# Patient Record
Sex: Female | Born: 1940 | Hispanic: Yes | Smoking: Never smoker
Health system: Southern US, Community
[De-identification: ages and names within clinical notes are randomized; demographics above are authoritative.]

## PROBLEM LIST (undated history)

## (undated) DIAGNOSIS — K859 Acute pancreatitis without necrosis or infection, unspecified: Secondary | ICD-10-CM

## (undated) DIAGNOSIS — E7800 Pure hypercholesterolemia, unspecified: Secondary | ICD-10-CM

## (undated) DIAGNOSIS — R12 Heartburn: Secondary | ICD-10-CM

## (undated) DIAGNOSIS — K5901 Slow transit constipation: Secondary | ICD-10-CM

## (undated) DIAGNOSIS — I10 Essential (primary) hypertension: Secondary | ICD-10-CM

## (undated) DIAGNOSIS — Z8669 Personal history of other diseases of the nervous system and sense organs: Secondary | ICD-10-CM

## (undated) HISTORY — DX: Heartburn: R12

## (undated) HISTORY — DX: Essential (primary) hypertension: I10

## (undated) HISTORY — DX: Pure hypercholesterolemia, unspecified: E78.00

## (undated) HISTORY — DX: Acute pancreatitis without necrosis or infection, unspecified: K85.90

## (undated) HISTORY — PX: CHOLECYSTECTOMY: SHX55

## (undated) HISTORY — DX: Personal history of other diseases of the nervous system and sense organs: Z86.69

## (undated) HISTORY — DX: Slow transit constipation: K59.01

---

## 2015-06-19 ENCOUNTER — Ambulatory Visit: Payer: Self-pay | Admitting: Family Medicine

## 2015-06-21 ENCOUNTER — Ambulatory Visit (INDEPENDENT_AMBULATORY_CARE_PROVIDER_SITE_OTHER)
Admission: RE | Admit: 2015-06-21 | Discharge: 2015-06-21 | Disposition: A | Discharge status: Home / Self Care | Payer: Medicare Other | Source: Ambulatory Visit | Attending: Family Medicine | Admitting: Family Medicine

## 2015-06-21 ENCOUNTER — Encounter: Payer: Self-pay | Admitting: Family Medicine

## 2015-06-21 ENCOUNTER — Ambulatory Visit (INDEPENDENT_AMBULATORY_CARE_PROVIDER_SITE_OTHER): Payer: Medicare Other | Admitting: Family Medicine

## 2015-06-21 VITALS — BP 132/80 | HR 77 | Wt 175.0 lb

## 2015-06-21 DIAGNOSIS — M79604 Pain in right leg: Secondary | ICD-10-CM | POA: Diagnosis not present

## 2015-06-21 DIAGNOSIS — M25561 Pain in right knee: Secondary | ICD-10-CM | POA: Diagnosis not present

## 2015-06-21 DIAGNOSIS — M79605 Pain in left leg: Secondary | ICD-10-CM | POA: Diagnosis not present

## 2015-06-21 DIAGNOSIS — M25562 Pain in left knee: Secondary | ICD-10-CM | POA: Diagnosis not present

## 2015-06-21 MED ORDER — GABAPENTIN 100 MG PO CAPS: 100.0000 mg | ORAL_CAPSULE | Freq: Every day | ORAL | Status: DC

## 2015-06-21 NOTE — Progress Notes (Signed)
[name] [address] [place], [place] [address] Phone: [phone] Subjective:   \  CC: bilaterally pain  EXB:[id number] Robin Hayes is a 75 y.o. female coming in with complaint of bilaterally pain. Patient is accompanied with son who is translating. Patient states that she is having bilateral calf pain. Has been going on for quite some time. Seems to be worsening recently. Seems to be worse at night. States that it is more of a cramping sensation. States very localized. Denies any numbness. Sometimes can give a dull aching throbbing sensation even when she is walking. Would not state that it is weakness but states that she does fatigue earlier. Has been a little less active than she was previously. Denies any significant swelling but has seen a vascular surgeon recently for possible vein stripping per son. Patient has been wearing compression sleeve with no significant improvement. Recent severity of pain is 8 out of 10. Patient states that the most concerning part is is affecting her sleep.     Past Medical History  Diagnosis Date  . Heart burn   . Hx of migraines   hypertension, Past Surgical History  Procedure Laterality Date  . Cesarean section     Social History   Social History  . Marital Status: Married    Spouse Name: N/A  . Number of Children: N/A  . Years of Education: N/A   Social History Main Topics  . Smoking status: Never Smoker   . Smokeless tobacco: Never Used  . Alcohol Use: No  . Drug Use: No  . Sexual Activity: Not on file   Other Topics Concern  . Not on file   Social History Narrative  . No narrative on file   Not on [place] known drug allergies No family history on file.no family history of rheumatological diseases.  Past medical history, social, surgical and family history all reviewed in electronic medical record.  No pertanent information unless stated regarding to the chief complaint.   Review of  Systems: No headache, visual changes, nausea, vomiting, diarrhea, constipation, dizziness, abdominal pain, skin rash, fevers, chills, night sweats, weight loss, swollen lymph nodes, body aches, joint swelling, muscle aches, chest pain, shortness of breath, mood changes.   Objective Blood pressure 132/80, pulse 77, weight 175 lb (79.379 kg), SpO2 96 %.  General: No apparent distress alert and oriented x3 mood and affect normal, dressed appropriately. obese HEENT: Pupils equal, extraocular movements intact  Respiratory: Patient's speak in full sentences and does not appear short of breath  Cardiovascular: No lower extremity edema, non tender, no erythema  Skin: Warm dry intact with no signs of infection or rash on extremities or on axial skeleton.  Abdomen: Soft nontender  Neuro: Cranial nerves II through XII are intact, neurovascularly intact in all extremities with 2+ DTRs and 2+ pulses.  Lymph: No lymphadenopathy of posterior or anterior cervical chain or axillae bilaterally.  Gait normal with good balance and coordination.  MSK:  Non tender with full range of motion and good stability and symmetric strength and tone of shoulders, elbows, wrist, hip, knee and ankles bilaterally.  Negative straight leg test. Patient is minimally tender to palpation over the piriformis musculature of the lumbar spine bilaterally. No spinous process tenderness. Neurovascular intact distally with full strength. 2+ DTRs. NONTENDER to compression. Soft no mass. Neurovascularly intact distally with good capillary refill.    Impression and Recommendations:     This case required medical decision making of moderate  complexity.      Note: This dictation was prepared with Dragon dictation along with smaller phrase technology. Any transcriptional errors that result from this process are unintentional.

## 2015-06-21 NOTE — Assessment & Plan Note (Signed)
Patient is having pain in the legs bilaterally. Secondary to patient's body habitus and being a poor historian this is a very broad differential. This can be anything from spinal stenosis to possible iron deficiency and muscle injuries. No sign of an injury today. We discussed with patient about an x-ray of the back to rule out any type of osteoarthritic changes of the back that could be contributing. Patient will be started on gabapentin 100 mg at night to see if this would be and no neurologic component that could be contribute in. In addition of this we discussed over-the-counter natural supplementations that are safe and effective for any type of cramping that seems to be more restless leg syndrome. We discussed continuing the compression sleeve to neovascular compromise could also be contribute. Patient has good dorsalis pedis pulses today. Patient will try to make these easy changes as well as we discussed proper shoe choices. If worsening symptoms in 3 weeks. Patient follows up we'll consider further imaging of the back for further workup or possibly even formal physical therapy.

## 2015-06-21 NOTE — Patient Instructions (Signed)
Good to see you  For the leg pain we will get an xray downstairs today of back pain Gabapentin  at nightcan help with sleep and the leg pain.  This is a prescription Vitamin D 2000 IU daily Iron 38-65mg  with  of vitamin C 3 times a week can help Continue with the compression stocking I hope it helps See me again in 3 weeks and if not better we will try a couple other tests.

## 2015-07-12 ENCOUNTER — Ambulatory Visit (INDEPENDENT_AMBULATORY_CARE_PROVIDER_SITE_OTHER): Payer: Medicare Other | Admitting: Family Medicine

## 2015-07-12 ENCOUNTER — Encounter: Payer: Self-pay | Admitting: Family Medicine

## 2015-07-12 VITALS — BP 136/84 | HR 77 | Wt 175.0 lb

## 2015-07-12 DIAGNOSIS — M79605 Pain in left leg: Secondary | ICD-10-CM | POA: Diagnosis not present

## 2015-07-12 DIAGNOSIS — M79604 Pain in right leg: Secondary | ICD-10-CM

## 2015-07-12 NOTE — Progress Notes (Signed)
  Robin Hayes D.O. [facility] [address] [place], [place] [id number] Phone: [phone] Subjective:   \  CC: bilaterally leg and back pain f/u   BJY:NWGNFAOZHYHPI:Subjective Robin Hayes is a 75 y.o. female coming in with complaint of bilaterally leg pain .  Concern with patient having potential spinal stenosis. Patient also could've potentially had peripheral neuropathy. Patient was given gabapentin. Patient states that she is doing 100% better. No longer having any back pain. Sitting currently at night. States that the leg pain is doing much better. She is being treated with varicose veins and is having some discomfort with this. Patient states that that is very manageable and is taking ibuprofen occasionally. Patient was a happy with the results.     Past Medical History  Diagnosis Date  . Heart burn   . Hx of migraines   hypertension, Past Surgical History  Procedure Laterality Date  . Cesarean section     Social History   Social History  . Marital Status: Married    Spouse Name: N/A  . Number of Children: N/A  . Years of Education: N/A   Social History Main Topics  . Smoking status: Never Smoker   . Smokeless tobacco: Never Used  . Alcohol Use: No  . Drug Use: No  . Sexual Activity: Not Asked   Other Topics Concern  . None   Social History Narrative   Not on [place] known drug allergies No family history on file.no family history of rheumatological diseases.  Past medical history, social, surgical and family history all reviewed in electronic medical record.  No pertanent information unless stated regarding to the chief complaint.   Review of Systems: No headache, visual changes, nausea, vomiting, diarrhea, constipation, dizziness, abdominal pain, skin rash, fevers, chills, night sweats, weight loss, swollen lymph nodes, body aches, joint swelling, muscle aches, chest pain, shortness of breath, mood changes.   Objective Blood pressure 136/84, pulse 77, weight  175 lb (79.379 kg), SpO2 96 %.  General: No apparent distress alert and oriented x3 mood and affect normal, dressed appropriately. obese HEENT: Pupils equal, extraocular movements intact  Respiratory: Patient's speak in full sentences and does not appear short of breath  Cardiovascular: No lower extremity edema, non tender, no erythema  Skin: Warm dry intact with no signs of infection or rash on extremities or on axial skeleton.  Abdomen: Soft nontender  Neuro: Cranial nerves II through XII are intact, neurovascularly intact in all extremities with 2+ DTRs and 2+ pulses.  Lymph: No lymphadenopathy of posterior or anterior cervical chain or axillae bilaterally.  Gait normal with good balance and coordination.  MSK:  Non tender with full range of motion and good stability and symmetric strength and tone of shoulders, elbows, wrist, hip, knee and ankles bilaterally.  Negative straight leg test.  Nontender on the back today. No spinous process tenderness. Neurovascular intact distally with full strength. 2+ DTRs. NONTENDER to compression. Soft no mass. Neurovascularly intact distally with good capillary refill.    Impression and Recommendations:     This case required medical decision making of moderate complexity.      Note: This dictation was prepared with Dragon dictation along with smaller phrase technology. Any transcriptional errors that result from this process are unintentional.

## 2015-07-12 NOTE — Assessment & Plan Note (Signed)
I do believe that the pain is likely from  Lumbar radiculopathy. Doing much better though at this time. No further workup. Continue to refill gabapentin as needed. Follow-up as needed.

## 2015-07-12 NOTE — Patient Instructions (Signed)
You are doing great  Continue the gabapentin indefinitely at night Ice when you are in pain  OK to take aleve and tylenol when needed Aleve 2 times a day and can take tylenol 3 times a day if needed Stay active, walking, biking or walking in pool could be good.  Bromelain is a enzyme that can help with gut inflammation.  2500mg  2 times daily  See me when you need me.

## 2016-12-09 ENCOUNTER — Emergency Department (HOSPITAL_COMMUNITY)
Admission: EM | Admit: 2016-12-09 | Discharge: 2016-12-10 | Discharge status: Absent without leave | Payer: Medicare Other

## 2016-12-12 DIAGNOSIS — R7989 Other specified abnormal findings of blood chemistry: Secondary | ICD-10-CM | POA: Diagnosis present

## 2016-12-12 DIAGNOSIS — K819 Cholecystitis, unspecified: Secondary | ICD-10-CM | POA: Insufficient documentation

## 2017-02-18 IMAGING — DX DG LUMBAR SPINE COMPLETE 4+V
5 series · 5 of 5 positions shown · non-contrast
Comparison: None.

CLINICAL DATA: Low back pain and bilateral leg pain with numbness
for months, no injury

EXAM:
LUMBAR SPINE - COMPLETE 4+ VIEW

[l-spine ap]
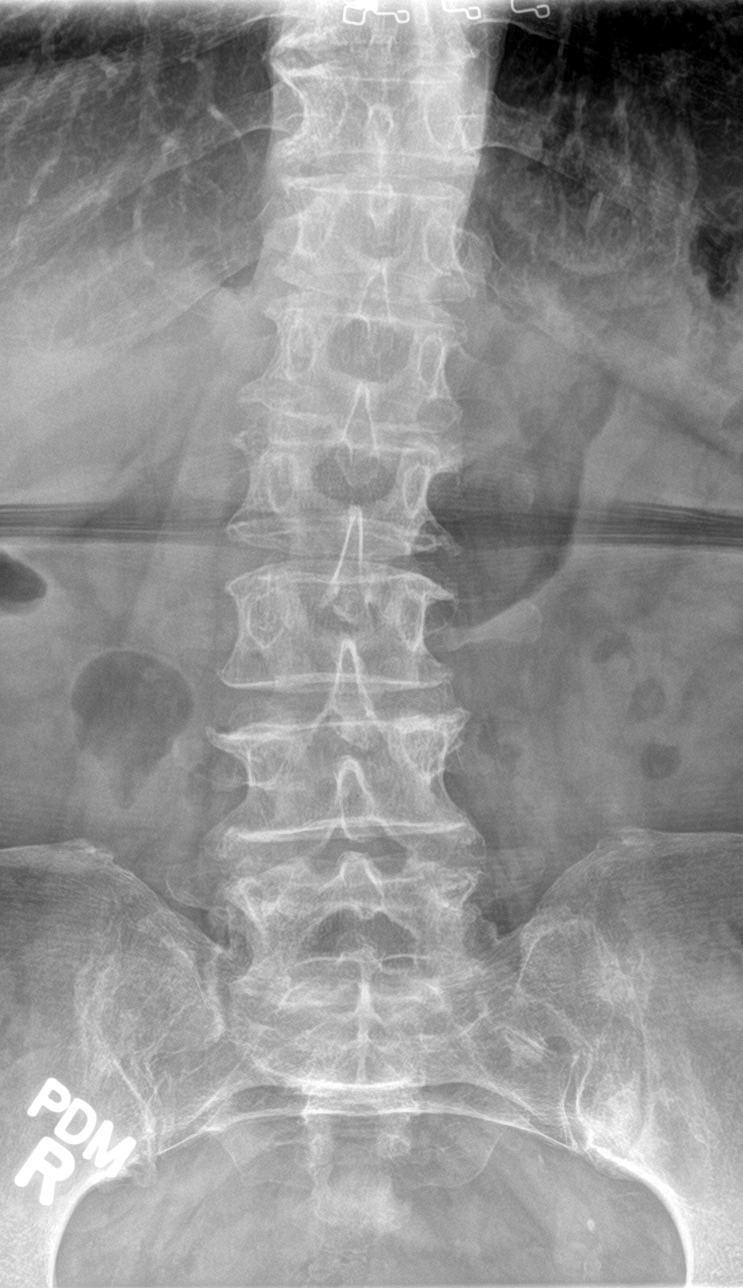

[l-spine obl (1 of 2)]
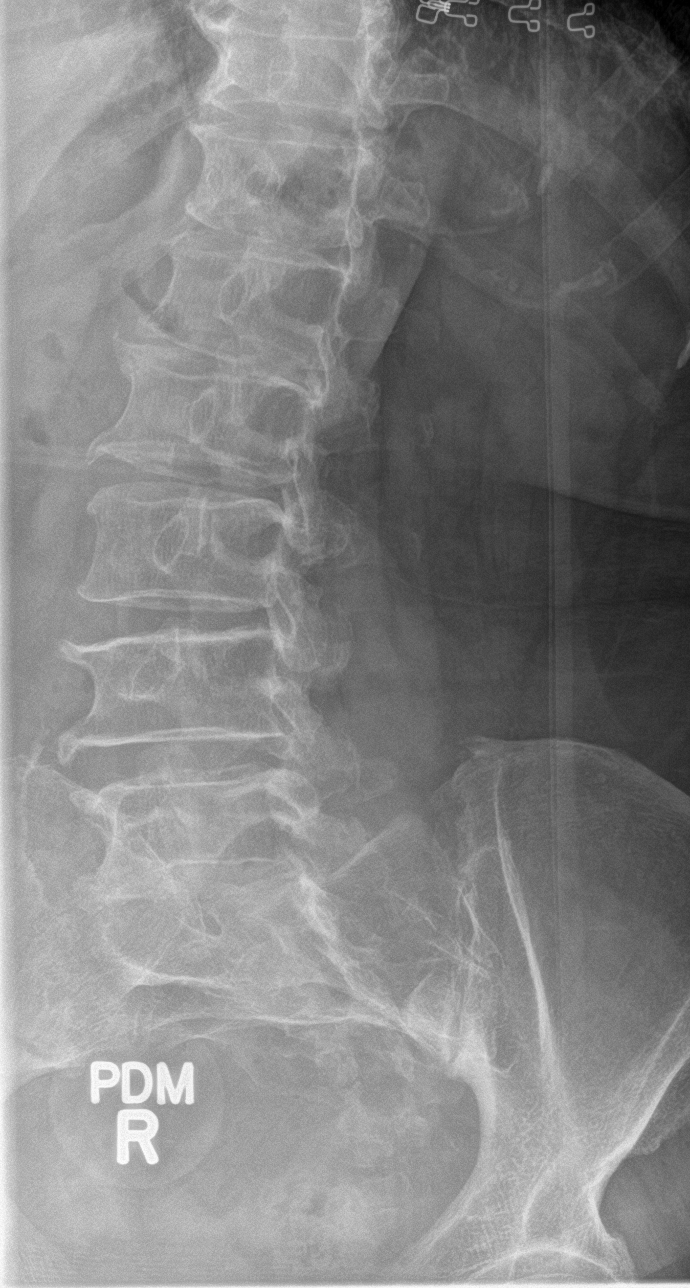

[l-spine obl (2 of 2)]
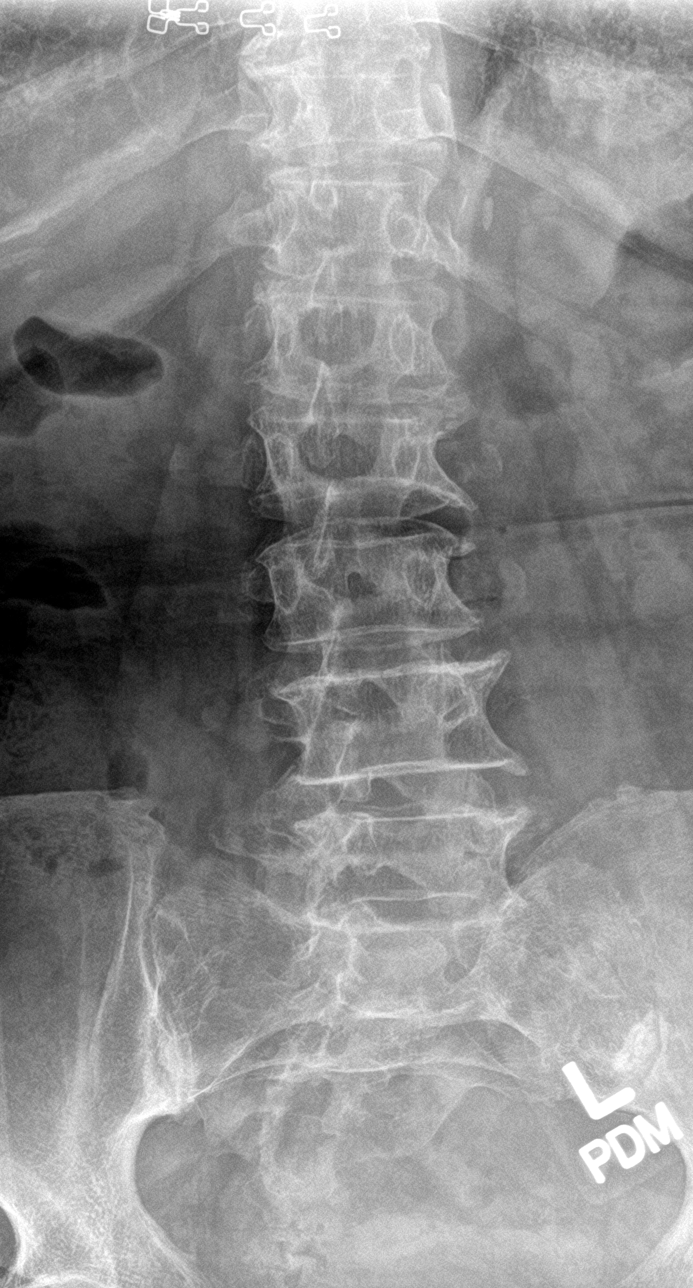

[l-spine lat]
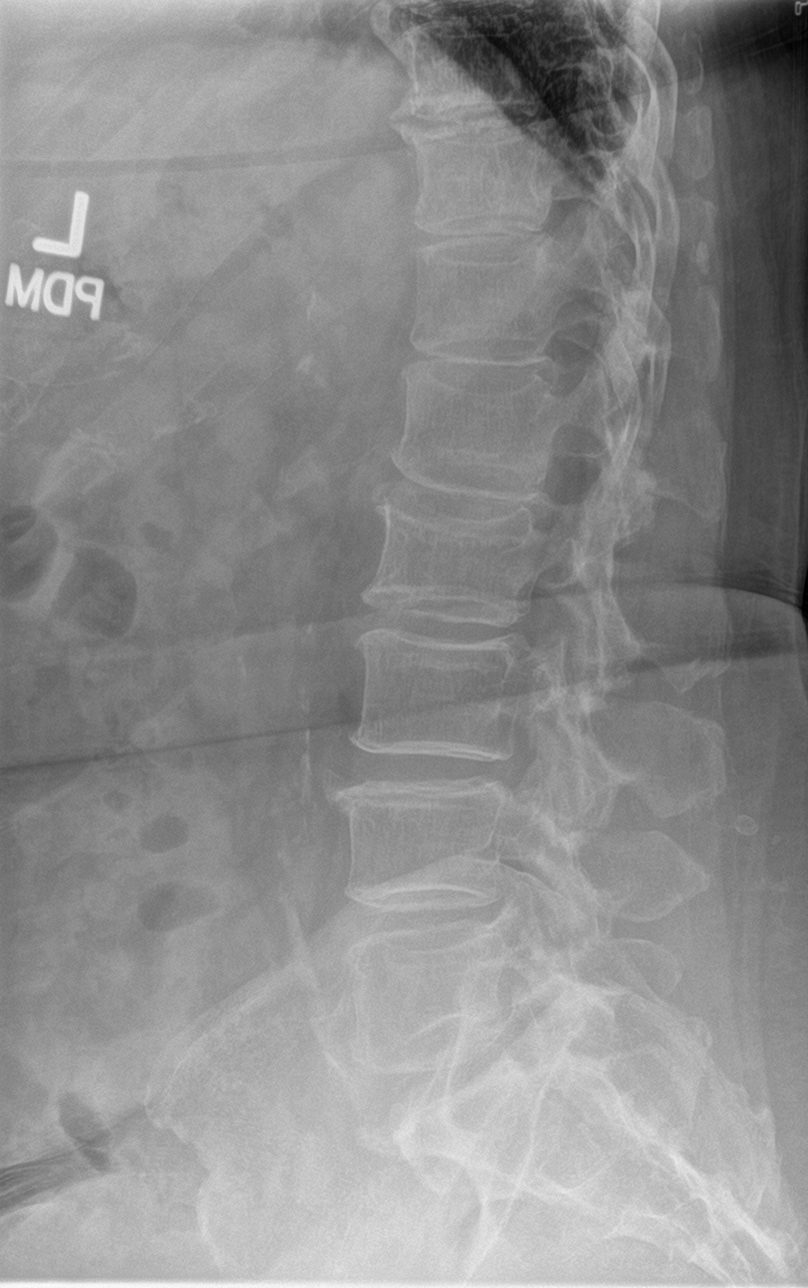

[l-spine spot]
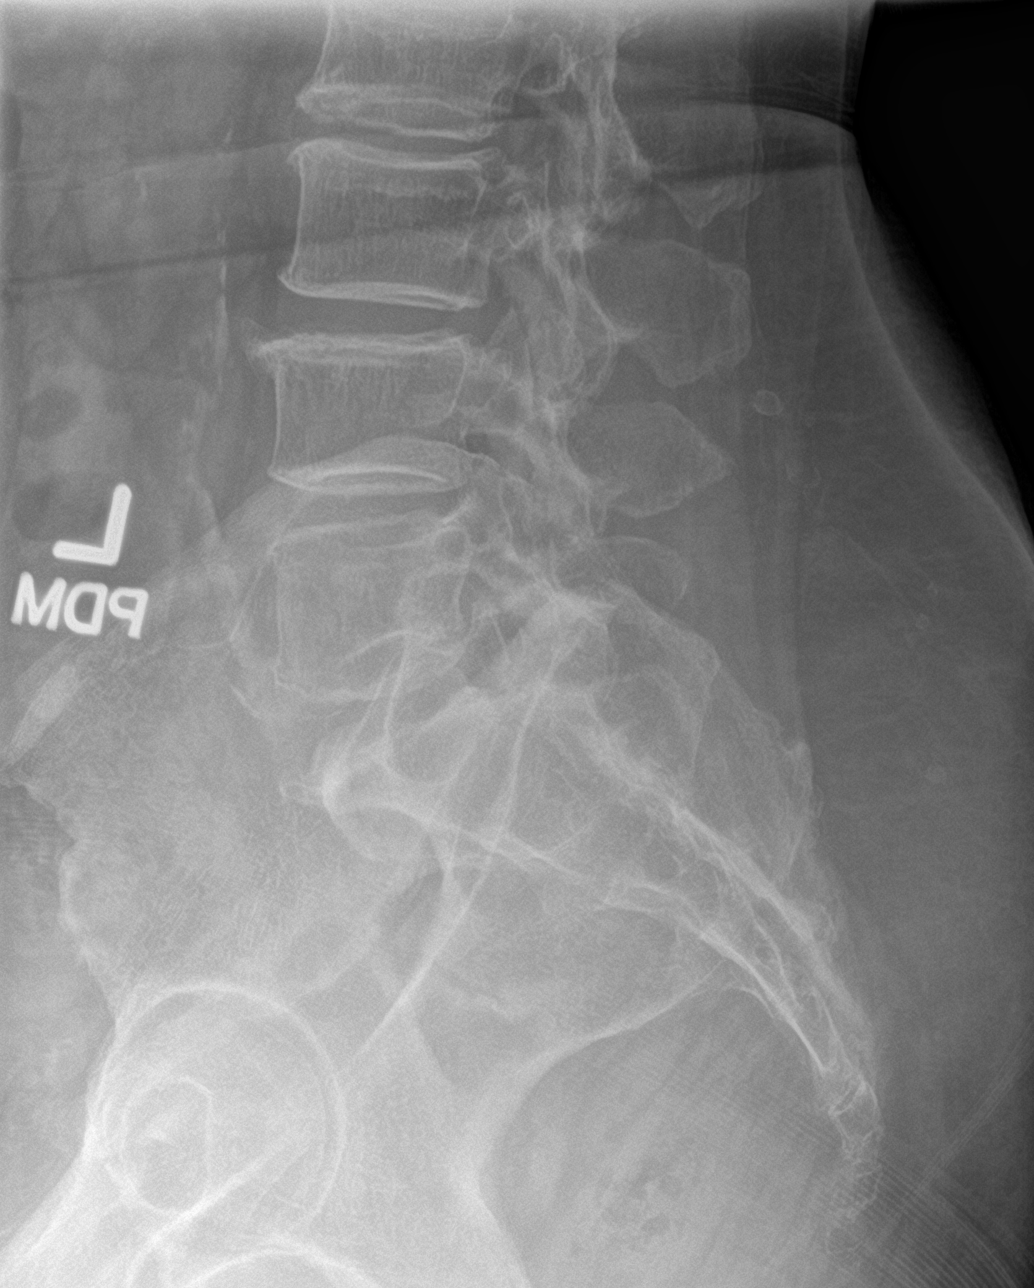

[5 of 5 positions shown; findings below may reference images not displayed]

FINDINGS: The lumbar vertebrae are normal alignment. The bones are diffusely
osteopenic. There does appear to be a very mild compression
deformity of L2 of uncertain age. No retropulsion is seen.
Intervertebral disc spaces appear grossly normal. The SI joints are
corticated.
IMPRESSION: 1. Mild compression deformity of L2 of uncertain age. No
retropulsion.
2. Normal alignment.  Diffuse osteopenia.

## 2020-04-07 DIAGNOSIS — K5901 Slow transit constipation: Secondary | ICD-10-CM | POA: Insufficient documentation

## 2020-04-09 DIAGNOSIS — E7800 Pure hypercholesterolemia, unspecified: Secondary | ICD-10-CM | POA: Insufficient documentation

## 2023-12-31 ENCOUNTER — Encounter: Payer: Self-pay | Admitting: Physician Assistant

## 2024-02-23 ENCOUNTER — Encounter: Payer: Self-pay | Admitting: Physician Assistant

## 2024-02-23 ENCOUNTER — Other Ambulatory Visit (INDEPENDENT_AMBULATORY_CARE_PROVIDER_SITE_OTHER)

## 2024-02-23 ENCOUNTER — Ambulatory Visit: Admitting: Physician Assistant

## 2024-02-23 VITALS — BP 100/58 | HR 83 | Ht 58.5 in | Wt 143.2 lb

## 2024-02-23 DIAGNOSIS — R7989 Other specified abnormal findings of blood chemistry: Secondary | ICD-10-CM

## 2024-02-23 DIAGNOSIS — K838 Other specified diseases of biliary tract: Secondary | ICD-10-CM | POA: Diagnosis not present

## 2024-02-23 DIAGNOSIS — R1011 Right upper quadrant pain: Secondary | ICD-10-CM | POA: Diagnosis not present

## 2024-02-23 DIAGNOSIS — R110 Nausea: Secondary | ICD-10-CM | POA: Diagnosis not present

## 2024-02-23 LAB — PROTIME-INR
INR: 1.3 ratio — ABNORMAL HIGH (ref 0.8–1.0)
Prothrombin Time: 13.8 s — ABNORMAL HIGH (ref 9.6–13.1)

## 2024-02-23 LAB — IBC + FERRITIN
Ferritin: 185.7 ng/mL (ref 10.0–291.0)
Iron: 79 ug/dL (ref 42–145)
Saturation Ratios: 25.8 % (ref 20.0–50.0)
TIBC: 306.6 ug/dL (ref 250.0–450.0)
Transferrin: 219 mg/dL (ref 212.0–360.0)

## 2024-02-23 LAB — CBC WITH DIFFERENTIAL/PLATELET
Basophils Absolute: 0 K/uL (ref 0.0–0.1)
Basophils Relative: 0.7 % (ref 0.0–3.0)
Eosinophils Absolute: 0.2 K/uL (ref 0.0–0.7)
Eosinophils Relative: 3 % (ref 0.0–5.0)
HCT: 38.8 % (ref 36.0–46.0)
Hemoglobin: 13 g/dL (ref 12.0–15.0)
Lymphocytes Relative: 25.1 % (ref 12.0–46.0)
Lymphs Abs: 1.9 K/uL (ref 0.7–4.0)
MCHC: 33.4 g/dL (ref 30.0–36.0)
MCV: 93.7 fl (ref 78.0–100.0)
Monocytes Absolute: 0.3 K/uL (ref 0.1–1.0)
Monocytes Relative: 4.3 % (ref 3.0–12.0)
Neutro Abs: 5 K/uL (ref 1.4–7.7)
Neutrophils Relative %: 66.9 % (ref 43.0–77.0)
Platelets: 235 K/uL (ref 150.0–400.0)
RBC: 4.15 Mil/uL (ref 3.87–5.11)
RDW: 16.2 % — ABNORMAL HIGH (ref 11.5–15.5)
WBC: 7.4 K/uL (ref 4.0–10.5)

## 2024-02-23 LAB — COMPREHENSIVE METABOLIC PANEL WITH GFR
ALT: 76 U/L — ABNORMAL HIGH (ref 0–35)
AST: 195 U/L — ABNORMAL HIGH (ref 0–37)
Albumin: 2.9 g/dL — ABNORMAL LOW (ref 3.5–5.2)
Alkaline Phosphatase: 893 U/L — ABNORMAL HIGH (ref 39–117)
BUN: 14 mg/dL (ref 6–23)
CO2: 25 meq/L (ref 19–32)
Calcium: 8.3 mg/dL — ABNORMAL LOW (ref 8.4–10.5)
Chloride: 102 meq/L (ref 96–112)
Creatinine, Ser: 0.63 mg/dL (ref 0.40–1.20)
GFR: 83.32 mL/min (ref >60.00)
Glucose, Bld: 120 mg/dL — ABNORMAL HIGH (ref 70–99)
Potassium: 3.6 meq/L (ref 3.5–5.1)
Sodium: 136 meq/L (ref 135–145)
Total Bilirubin: 4.8 mg/dL — ABNORMAL HIGH (ref 0.2–1.2)
Total Protein: 7.2 g/dL (ref 6.0–8.3)

## 2024-02-23 LAB — LIPASE: Lipase: 7 U/L — ABNORMAL LOW (ref 11.0–59.0)

## 2024-02-23 NOTE — Patient Instructions (Signed)
 You have been scheduled for an MRI at [facility] on 02/24/24 . Your appointment time is 3:00 pm. Please arrive to admitting (at main entrance of the hospital) 30 minutes prior to your appointment time for registration purposes. Please make certain not to have anything to eat or drink 6 hours prior to your test. In addition, if you have any metal in your body, have a pacemaker or defibrillator, please be sure to let your ordering physician know. This test typically takes 45 minutes to 1 hour to complete. Should you need to reschedule, please call [phone] to do so.  Your provider has requested that you go to the basement level for lab work before leaving today. Press B on the elevator. The lab is located at the first door on the left as you exit the elevator.  Due to recent changes in healthcare laws, you may see the results of your imaging and laboratory studies on MyChart before your provider has had a chance to review them.  We understand that in some cases there may be results that are confusing or concerning to you. Not all laboratory results come back in the same time frame and the provider may be waiting for multiple results in order to interpret others.  Please give us  48 hours in order for your provider to thoroughly review all the results before contacting the office for clarification of your results.   _______________________________________________________  If your blood pressure at your visit was 140/90 or greater, please contact your primary care physician to follow up on this.  _______________________________________________________  If you are age 81 or older, your body mass index should be between 23-30. Your Body mass index is 29.43 kg/m. If this is out of the aforementioned range listed, please consider follow up with your Primary Care Provider.  If you are age 81 or younger, your body mass index should be between 19-25. Your Body mass index is 29.43 kg/m. If this is out of  the aformentioned range listed, please consider follow up with your Primary Care Provider.   ________________________________________________________  The [facility] providers would like to encourage you to use MYCHART to communicate with providers for non-urgent requests or questions.  Due to long hold times on the telephone, sending your provider a message by [facility] may be a faster and more efficient way to get a response.  Please allow 48 business hours for a response.  Please remember that this is for non-urgent requests.  _______________________________________________________  [facility] is using a team-based approach to care.  Your team is made up of your doctor and two to three APPS. Our APPS (Nurse Practitioners and Physician Assistants) work with your physician to ensure care continuity for you. They are fully qualified to address your health concerns and develop a treatment plan. They communicate directly with your gastroenterologist to care for you. Seeing the Advanced Practice Practitioners on your physician's team can help you by facilitating care more promptly, often allowing for earlier appointments, access to diagnostic testing, procedures, and other specialty referrals.   Thank you for choosing me and [facility].  Delon Failing, PA-C

## 2024-02-23 NOTE — Progress Notes (Signed)
 Chief Complaint: Elevated LFTs  HPI:    Robin Hayes is a 83 year old Spanish-speaking female with a past medical history as listed below, echo 01/12/2024 with LVEF 55-60%, right bundle branch block, mild diastolic dysfunction, who presents to clinic today as a referral for elevated liver enzymes.    12/23/2016 MRI of the abdomen/MRCP with no biliary ductal dilation, findings suggestive of acute cholecystitis, mild acute interstitial edematous pancreatitis and hepatic steatosis.    12/26/2016 CMP with an elevated ALT at 58 and AST at 72, alk phos elevated 176, normal T. bili.    06/11/2022 alk phos 125, AST 38, ALT 28, T. bili 0.3.    07/14/2023 alk phos 579, AST 120, ALT 128.  T. bili normal at 0.3.  Hep B surface antigen, core total antibody and surface antibody all nonreactive/negative.  Hep C antibody nonreactive.    07/31/2023 abdominal ultrasound with dilated CBD of 1.4 cm, may relate to postcholecystectomy changes, correlate with lab data, if abnormal follow-up MRCP.  Mild ectasia of the abdominal aorta.  Unremarkable sonographic appearance of the liver.    12/17/2023 abdominal x-ray nonspecific, moderate formed stool throughout the colon and rectum representing constipation.    12/17/2023 normal CBC, CMP with a total bili of 2.2, alk phos 885, AST 177, ALT 88 (07/23/2023 alk phos 662, AST 164, ALT 172).    12/17/2023 vitamin D low at 22.5.    01/22/2024 patient saw PCP and at that time was following up for constipation, cough and liver inflammation.  Discussed constipation successfully managed with MiraLAX.  At that time discussed diagnosis of liver inflammation with liver function test normal negative for hepatitis.  No alcohol use.  Tylenol for knee pain.  Discussed a weight loss of 20 pounds over the past year.  Apparently on a heart monitor for the past 2 weeks.  Echo revealed normal heart function with a mild leaky valve.  Cardiologist with plan for repeat ultrasound in 2 years.    Today, the  patient presents to clinic accompanied by her daughter who assists with interpreting.  Together they explained that she has had a right upper quadrant pain ever since the beginning of September/late August for which she went to see her PCP.  At that time told that she had elevated liver enzymes.  Apparently had experiencing some fever and chills around then and was given some antibiotics, though I cannot see record of this and things seemed to get slightly better as far as fever and chills, but she has continued with a right upper quadrant pain and a decreased appetite as well as nausea after eating.  Describes a pulsing pain in her right upper quadrant rated as an 8-9/10 at times definitely worse after eating or if she is very hungry.  Has also noticed that her urine is a very dark color which started back in August.  Describes was previously constipated but this is better after initial MiraLAX bowel purge and now on daily MiraLAX.  No further fever or chills, no vomiting.  No heartburn or reflux.    Denies weight loss or blood in her stool.  Past Medical History:  Diagnosis Date   Heart burn    Hx of migraines    Hypercholesteremia    Hypertension    Pancreatitis    Slow transit constipation     Past Surgical History:  Procedure Laterality Date   CESAREAN SECTION     CHOLECYSTECTOMY      Current Outpatient Medications  Medication Sig  Dispense Refill   famotidine (PEPCID) 20 MG tablet Take 20 mg by mouth 2 (two) times daily.     lisinopril (ZESTRIL) 30 MG tablet Take 30 mg by mouth daily.     magnesium gluconate (MAGONATE) 500 MG tablet Take 500 mg by mouth daily.     Omega-3 Fatty Acids (FISH OIL) 1000 MG CAPS Take 1,000 mg by mouth daily.     polyethylene glycol powder (GLYCOLAX/MIRALAX) 17 GM/SCOOP powder Take 17 g by mouth daily.     rosuvastatin (CRESTOR) 20 MG tablet Take 20 mg by mouth daily.     Vitamin D, Ergocalciferol, (DRISDOL) 1.25 MG (50000 UNIT) CAPS capsule Take 50,000  Units by mouth every 7 (seven) days.     Vitamin D-Vitamin K (VITAMIN K2-VITAMIN D3 PO) Take by mouth daily.     gabapentin  (NEURONTIN ) 100 MG capsule Take 1 capsule (100 mg total) by mouth at bedtime. (Patient not taking: Reported on 02/23/2024) 30 capsule 3   No current facility-administered medications for this visit.    Allergies as of 02/23/2024   (Not on File)    No family history on file.  Social History   Socioeconomic History   Marital status: Widowed    Spouse name: Not on file   Number of children: 5   Years of education: Not on file   Highest education level: Not on file  Occupational History   Not on file  Tobacco Use   Smoking status: Never   Smokeless tobacco: Never  Vaping Use   Vaping status: Never Used  Substance and Sexual Activity   Alcohol use: No   Drug use: No   Sexual activity: Not on file  Other Topics Concern   Not on file  Social History Narrative   Not on file   Social Drivers of Health   Financial Resource Strain: Low Risk  (07/14/2023)   Received from [facility]   Overall Financial Resource Strain (CARDIA)    Difficulty of Paying Living Expenses: Not very hard  Food Insecurity: No Food Insecurity (07/14/2023)   Received from [facility]   Hunger Vital Sign    Within the past 12 months, you worried that your food would run out before you got the money to buy more.: Never true    Within the past 12 months, the food you bought just didn't last and you didn't have money to get more.: Never true  Transportation Needs: No Transportation Needs (07/14/2023)   Received from [facility] - Transportation    Lack of Transportation (Medical): No    Lack of Transportation (Non-Medical): No  Physical Activity: Sufficiently Active (07/14/2023)   Received from [facility]   Exercise Vital Sign    On average, how many days per week do you engage in moderate to strenuous exercise (like a brisk walk)?: 7 days    On average, how many  minutes do you engage in exercise at this level?: 30 min  Stress: No Stress Concern Present (07/14/2023)   Received from [facility] - Occupational Stress Questionnaire    Feeling of Stress : Not at all  Social Connections: Moderately Integrated (07/14/2023)   Received from [facility]   Social Network    How would you rate your social network (family, work, friends)?: Adequate participation with social networks  Intimate Partner Violence: Not At Risk (07/14/2023)   Received from [facility]   HITS    Over the last  12 months how often did your partner physically hurt you?: Never    Over the last 12 months how often did your partner insult you or talk down to you?: Never    Over the last 12 months how often did your partner threaten you with physical harm?: Never    Over the last 12 months how often did your partner scream or curse at you?: Never    Review of Systems:    Constitutional: No weight loss Skin: No rash  Cardiovascular: No chest pain Respiratory: No SOB  Gastrointestinal: See HPI and otherwise negative Genitourinary: No dysuria Neurological: No headache, dizziness or syncope Musculoskeletal: No new muscle or joint pain Hematologic: No bleeding  Psychiatric: No history of depression or anxiety   Physical Exam:  Vital signs: BP (!) 100/58 (BP Location: Left Arm, Patient Position: Sitting, Cuff Size: Normal)   Pulse 83   Ht 4' 10.5 (1.486 m) Comment: Height measured without shoes  Wt 143 lb 4 oz (65 kg)   BMI 29.43 kg/m   Constitutional:   Pleasant Hispanic female appears to be in NAD, Well developed, Well nourished, alert and cooperative Head:  Normocephalic and atraumatic. Eyes:   PEERL, EOMI. No icterus. Conjunctiva pink. Ears:  Normal auditory acuity. Neck:  Supple Throat: Oral cavity and pharynx without inflammation, swelling or lesion.  Respiratory: Respirations even and unlabored. Lungs clear to auscultation  bilaterally.   No wheezes, crackles, or rhonchi.  Cardiovascular: Normal S1, S2. No MRG. Regular rate and rhythm. No peripheral edema, cyanosis or pallor.  Gastrointestinal:  Soft, nondistended, moderate RUQ ttp. No rebound or guarding. Normal bowel sounds. No appreciable masses or hepatomegaly. Rectal:  Not performed.  Msk:  Symmetrical without gross deformities. Without edema, no deformity or joint abnormality.  Neurologic:  Alert and  oriented x4;  grossly normal neurologically.  Skin:   Dry and intact without significant lesions or rashes. Psychiatric: Demonstrates good judgement and reason without abnormal affect or behaviors.  See HPI for recent labs and imaging.  Assessment: 1.  Elevated LFTs: Elevated since 07/14/2023, initially bili normal, but most recent labs 12/17/2023 normal CBC, CMP with a total bili of 2.2, alk phos 885, AST 177, ALT 88 (previously 07/23/2023 alk phos 662, AST 164, ALT 172), ultrasound showing dilated CBD in March, continues with right upper quadrant pain; consider autoimmune versus inflammatory versus choledocholithiasis, possibility of passed stone with fever and chills back in August, has not experienced since then 2.  Right upper quadrant pain: See above 3.  Dilated CBD: Up to 1.4 cm on 3/7, possibly postcholecystectomy effect, but with elevated LFTs needs further follow-up for possible microlithiasis versus choledocholithiasis 4.  Nausea: With the above  Plan: 1.  Scheduled patient for an MRI/MRCP for further evaluation of dilated CBD and elevated LFTs continued right upper quadrant pain 2.  Further liver workup today including repeat CBC, CMP, iron studies, lipase, PT/INR, IgG, IgA, ittg, ANA, ASMA, AMA, alpha-1 antitrypsin, ceruloplasmin and hep A workup 3.  Advised patient to stay away from greasy fatty foods for now 4.  Pending liver workup could consider EGD.  Patient to follow in clinic per recommendations after labs and imaging above.  Assigned to Dr.  [place] today.  Robin Failing, PA-C [facility] 02/23/2024, 11:48 AM

## 2024-02-24 ENCOUNTER — Other Ambulatory Visit: Payer: Self-pay | Admitting: Physician Assistant

## 2024-02-24 ENCOUNTER — Ambulatory Visit (HOSPITAL_COMMUNITY)
Admission: RE | Admit: 2024-02-24 | Discharge: 2024-02-24 | Disposition: A | Discharge status: Home / Self Care | Source: Ambulatory Visit | Attending: Physician Assistant | Admitting: Physician Assistant

## 2024-02-24 DIAGNOSIS — R1011 Right upper quadrant pain: Secondary | ICD-10-CM | POA: Insufficient documentation

## 2024-02-24 DIAGNOSIS — R7989 Other specified abnormal findings of blood chemistry: Secondary | ICD-10-CM

## 2024-02-24 DIAGNOSIS — K858 Other acute pancreatitis without necrosis or infection: Secondary | ICD-10-CM

## 2024-02-24 DIAGNOSIS — K8050 Calculus of bile duct without cholangitis or cholecystitis without obstruction: Secondary | ICD-10-CM | POA: Diagnosis not present

## 2024-02-24 DIAGNOSIS — K8051 Calculus of bile duct without cholangitis or cholecystitis with obstruction: Secondary | ICD-10-CM | POA: Diagnosis not present

## 2024-02-24 MED ORDER — GADOBUTROL 1 MMOL/ML IV SOLN
6.0000 mL | Freq: Once | INTRAVENOUS | Status: AC | PRN
Start: 2024-02-24 — End: 2024-02-24
  Administered 2024-02-24: 6 mL via INTRAVENOUS

## 2024-02-24 NOTE — Progress Notes (Signed)
 Attending Physician's Attestation   I have reviewed the chart.   I agree with the Advanced Practitioner's note, impression, and recommendations with any updates as below. Rule out choledocholithiasis is most important and agree with imaging as outlined as well as laboratories to rule out other causes for elevations in liver biochemical testing.   Aloha Finner, MD [facility] Advanced [facility]oscopy Office # [phone]

## 2024-02-26 ENCOUNTER — Telehealth: Payer: Self-pay

## 2024-02-26 NOTE — Telephone Encounter (Signed)
 Called report-see impression  IMPRESSION: 1. Choledocholithiasis, 0.7 cm calculus at the ampulla. 2. Severe intra and extrahepatic biliary ductal dilatation, the common bile duct measuring up to 1.6 cm in caliber. 3. Cholecystectomy. 4. Moderate volume ascites throughout the abdomen and pelvis. 5. Mild splenomegaly.   These results will be called to the ordering clinician or representative by the Radiologist Assistant, and communication documented in the PACS or [employer].  Patient of Dr Wilhelmenia. Seen by Delon 02/23/24

## 2024-02-27 ENCOUNTER — Other Ambulatory Visit: Payer: Self-pay

## 2024-02-27 ENCOUNTER — Inpatient Hospital Stay (HOSPITAL_COMMUNITY)
Admission: EM | Admit: 2024-02-27 | Discharge: 2024-03-01 | DRG: 445 | Disposition: A | Discharge status: Home / Self Care | Attending: Internal Medicine | Admitting: Internal Medicine

## 2024-02-27 ENCOUNTER — Encounter (HOSPITAL_COMMUNITY): Payer: Self-pay | Admitting: Emergency Medicine

## 2024-02-27 ENCOUNTER — Ambulatory Visit: Payer: Self-pay

## 2024-02-27 DIAGNOSIS — K3189 Other diseases of stomach and duodenum: Secondary | ICD-10-CM | POA: Diagnosis present

## 2024-02-27 DIAGNOSIS — D696 Thrombocytopenia, unspecified: Secondary | ICD-10-CM | POA: Diagnosis present

## 2024-02-27 DIAGNOSIS — Z9049 Acquired absence of other specified parts of digestive tract: Secondary | ICD-10-CM

## 2024-02-27 DIAGNOSIS — K838 Other specified diseases of biliary tract: Secondary | ICD-10-CM

## 2024-02-27 DIAGNOSIS — K2289 Other specified disease of esophagus: Secondary | ICD-10-CM | POA: Diagnosis not present

## 2024-02-27 DIAGNOSIS — I700 Atherosclerosis of aorta: Secondary | ICD-10-CM | POA: Diagnosis present

## 2024-02-27 DIAGNOSIS — E7800 Pure hypercholesterolemia, unspecified: Secondary | ICD-10-CM | POA: Diagnosis present

## 2024-02-27 DIAGNOSIS — K2970 Gastritis, unspecified, without bleeding: Secondary | ICD-10-CM | POA: Diagnosis present

## 2024-02-27 DIAGNOSIS — K7469 Other cirrhosis of liver: Secondary | ICD-10-CM | POA: Diagnosis present

## 2024-02-27 DIAGNOSIS — Z8249 Family history of ischemic heart disease and other diseases of the circulatory system: Secondary | ICD-10-CM | POA: Diagnosis not present

## 2024-02-27 DIAGNOSIS — E785 Hyperlipidemia, unspecified: Secondary | ICD-10-CM | POA: Diagnosis not present

## 2024-02-27 DIAGNOSIS — K31A19 Gastric intestinal metaplasia without dysplasia, unspecified site: Secondary | ICD-10-CM | POA: Diagnosis not present

## 2024-02-27 DIAGNOSIS — Z603 Acculturation difficulty: Secondary | ICD-10-CM | POA: Diagnosis present

## 2024-02-27 DIAGNOSIS — E669 Obesity, unspecified: Secondary | ICD-10-CM | POA: Diagnosis present

## 2024-02-27 DIAGNOSIS — R1011 Right upper quadrant pain: Secondary | ICD-10-CM | POA: Diagnosis not present

## 2024-02-27 DIAGNOSIS — R161 Splenomegaly, not elsewhere classified: Secondary | ICD-10-CM | POA: Diagnosis present

## 2024-02-27 DIAGNOSIS — K766 Portal hypertension: Secondary | ICD-10-CM | POA: Diagnosis present

## 2024-02-27 DIAGNOSIS — E875 Hyperkalemia: Secondary | ICD-10-CM | POA: Diagnosis present

## 2024-02-27 DIAGNOSIS — R7989 Other specified abnormal findings of blood chemistry: Secondary | ICD-10-CM

## 2024-02-27 DIAGNOSIS — R188 Other ascites: Secondary | ICD-10-CM | POA: Diagnosis present

## 2024-02-27 DIAGNOSIS — Z66 Do not resuscitate: Secondary | ICD-10-CM | POA: Diagnosis present

## 2024-02-27 DIAGNOSIS — K7460 Unspecified cirrhosis of liver: Secondary | ICD-10-CM | POA: Diagnosis not present

## 2024-02-27 DIAGNOSIS — I10 Essential (primary) hypertension: Secondary | ICD-10-CM | POA: Diagnosis present

## 2024-02-27 DIAGNOSIS — Z79899 Other long term (current) drug therapy: Secondary | ICD-10-CM

## 2024-02-27 DIAGNOSIS — I8500 Esophageal varices without bleeding: Secondary | ICD-10-CM | POA: Diagnosis not present

## 2024-02-27 DIAGNOSIS — K8050 Calculus of bile duct without cholangitis or cholecystitis without obstruction: Principal | ICD-10-CM | POA: Diagnosis present

## 2024-02-27 DIAGNOSIS — K8051 Calculus of bile duct without cholangitis or cholecystitis with obstruction: Principal | ICD-10-CM | POA: Diagnosis present

## 2024-02-27 DIAGNOSIS — K769 Liver disease, unspecified: Secondary | ICD-10-CM | POA: Diagnosis not present

## 2024-02-27 DIAGNOSIS — I8510 Secondary esophageal varices without bleeding: Secondary | ICD-10-CM | POA: Diagnosis present

## 2024-02-27 DIAGNOSIS — K2950 Unspecified chronic gastritis without bleeding: Secondary | ICD-10-CM | POA: Diagnosis not present

## 2024-02-27 DIAGNOSIS — K831 Obstruction of bile duct: Secondary | ICD-10-CM

## 2024-02-27 LAB — COMPREHENSIVE METABOLIC PANEL WITH GFR
ALT: 96 U/L — ABNORMAL HIGH (ref 0–44)
AST: 262 U/L — ABNORMAL HIGH (ref 15–41)
Albumin: 3 g/dL — ABNORMAL LOW (ref 3.5–5.0)
Alkaline Phosphatase: 1015 U/L — ABNORMAL HIGH (ref 38–126)
Anion gap: 10 (ref 5–15)
BUN: 15 mg/dL (ref 8–23)
CO2: 23 mmol/L (ref 22–32)
Calcium: 8.8 mg/dL — ABNORMAL LOW (ref 8.9–10.3)
Chloride: 103 mmol/L (ref 98–111)
Creatinine, Ser: 0.76 mg/dL (ref 0.44–1.00)
GFR, Estimated: >60 mL/min (ref >60)
Glucose, Bld: 100 mg/dL — ABNORMAL HIGH (ref 70–99)
Potassium: 5.2 mmol/L — ABNORMAL HIGH (ref 3.5–5.1)
Sodium: 136 mmol/L (ref 135–145)
Total Bilirubin: 4.9 mg/dL — ABNORMAL HIGH (ref 0.0–1.2)
Total Protein: 7.5 g/dL (ref 6.5–8.1)

## 2024-02-27 LAB — MITOCHONDRIAL ANTIBODIES: Mitochondrial M2 Ab, IgG: <=20 U (ref <=20.0)

## 2024-02-27 LAB — URINALYSIS, ROUTINE W REFLEX MICROSCOPIC
Glucose, UA: NEGATIVE mg/dL
Hgb urine dipstick: NEGATIVE
Ketones, ur: NEGATIVE mg/dL
Leukocytes,Ua: NEGATIVE
Nitrite: NEGATIVE
Protein, ur: NEGATIVE mg/dL
Specific Gravity, Urine: 1.025 (ref 1.005–1.030)
pH: 5 (ref 5.0–8.0)

## 2024-02-27 LAB — ANTI-SMOOTH MUSCLE ANTIBODY, IGG: Actin (Smooth Muscle) Antibody (IGG): <20 U (ref <20)

## 2024-02-27 LAB — CBC
HCT: 41.6 % (ref 36.0–46.0)
Hemoglobin: 13.3 g/dL (ref 12.0–15.0)
MCH: 30.9 pg (ref 26.0–34.0)
MCHC: 32 g/dL (ref 30.0–36.0)
MCV: 96.7 fL (ref 80.0–100.0)
Platelets: 250 K/uL (ref 150–400)
RBC: 4.3 MIL/uL (ref 3.87–5.11)
RDW: 15.4 % (ref 11.5–15.5)
WBC: 9.3 K/uL (ref 4.0–10.5)
nRBC: 0 % (ref 0.0–0.2)

## 2024-02-27 LAB — ANTI-NUCLEAR AB-TITER (ANA TITER)
ANA TITER: 1:320 {titer} — ABNORMAL HIGH
ANA Titer 1: 1:320 {titer} — ABNORMAL HIGH

## 2024-02-27 LAB — TISSUE TRANSGLUTAMINASE, IGA: (tTG) Ab, IgA: <1 U/mL

## 2024-02-27 LAB — HEPATITIS A ANTIBODY, TOTAL: Hepatitis A AB,Total: REACTIVE — AB

## 2024-02-27 LAB — ANA: Anti Nuclear Antibody (ANA): POSITIVE — AB

## 2024-02-27 LAB — CERULOPLASMIN: Ceruloplasmin: 37 mg/dL (ref 14–48)

## 2024-02-27 LAB — IGA: Immunoglobulin A: 586 mg/dL — ABNORMAL HIGH (ref 70–320)

## 2024-02-27 LAB — LIPASE, BLOOD: Lipase: 10 U/L — ABNORMAL LOW (ref 11–51)

## 2024-02-27 LAB — IGG: IgG (Immunoglobin G), Serum: 1962 mg/dL — ABNORMAL HIGH (ref 600–1540)

## 2024-02-27 LAB — ALPHA-1-ANTITRYPSIN: A-1 Antitrypsin, Ser: 267 mg/dL — ABNORMAL HIGH (ref 83–199)

## 2024-02-27 MED ORDER — ACETAMINOPHEN 650 MG RE SUPP: 650.0000 mg | Freq: Four times a day (QID) | RECTAL | Status: DC | PRN

## 2024-02-27 MED ORDER — ONDANSETRON HCL 4 MG PO TABS: 4.0000 mg | ORAL_TABLET | Freq: Four times a day (QID) | ORAL | Status: DC | PRN

## 2024-02-27 MED ORDER — POLYETHYLENE GLYCOL 3350 17 G PO PACK: 17.0000 g | PACK | Freq: Every day | ORAL | Status: DC | PRN

## 2024-02-27 MED ORDER — SODIUM CHLORIDE 0.9 % IV SOLN: INTRAVENOUS | Status: AC

## 2024-02-27 MED ORDER — ACETAMINOPHEN 325 MG PO TABS: 650.0000 mg | ORAL_TABLET | Freq: Four times a day (QID) | ORAL | Status: DC | PRN

## 2024-02-27 MED ORDER — ONDANSETRON HCL 4 MG/2ML IJ SOLN: 4.0000 mg | Freq: Four times a day (QID) | INTRAMUSCULAR | Status: DC | PRN

## 2024-02-27 NOTE — Consult Note (Addendum)
 Referring Provider: None Primary Care Physician:  Patient, No Pcp Per Primary Gastroenterologist:  Dr. Wilhelmenia  Reason for Consultation: Choledocholithiasis  HPI: Robin Hayes is a 83 y.o. female with past medical history of cholecystectomy, hypertension, hyperlipidemia who was recently seen in our office for evaluation of elevated liver enzymes.  She is also been having abdominal pain.  Please see note from 02/23/2024.  She had an ultrasound that showed a dilated common bile duct.  Outpatient MRI/MRCP was ordered and extensive liver serologies were ordered as well to rule out causes of chronic liver disease.  MRI was performed yesterday and as below showed a stone in her common bile duct at the level of the ampulla causing extensive biliary dilatation.  LFTs elevated with a total bili of about 5.  On her other evaluation ANA is quite elevated and IgG and IgA are elevated, but anti-smooth muscle antibody and AMA were normal.  She was contacted this morning to proceed to the emergency department to undergo ERCP for removal of the stone.  MRI abdomen/MRCP: IMPRESSION: 1. Choledocholithiasis, 0.7 cm calculus at the ampulla. 2. Severe intra and extrahepatic biliary ductal dilatation, the common bile duct measuring up to 1.6 cm in caliber. 3. Cholecystectomy. 4. Moderate volume ascites throughout the abdomen and pelvis. 5. Mild splenomegaly.   These results will be called to the ordering clinician or representative by the Radiologist Assistant, and communication documented in the PACS or [employer].   Aortic Atherosclerosis (ICD10-I70.0).  The daughter is at bedside.  She tells me that her mother's been having abdominal pain for a few months.  She has been having some fevers on and off as well.  She has been eating although not a lot.  She did have some oatmeal for breakfast and is hungry now, is wanting to eat.  Past Medical History:  Diagnosis Date   Heart burn    Hx of  migraines    Hypercholesteremia    Hypertension    Pancreatitis    Slow transit constipation     Past Surgical History:  Procedure Laterality Date   CESAREAN SECTION     CHOLECYSTECTOMY      Prior to Admission medications   Medication Sig Start Date End Date Taking? Authorizing Provider  famotidine (PEPCID) 20 MG tablet Take 20 mg by mouth 2 (two) times daily. 02/04/24   [provider]  gabapentin  (NEURONTIN ) 100 MG capsule Take 1 capsule (100 mg total) by mouth at bedtime. Patient not taking: Reported on 02/23/2024 06/21/15   Smith, Zachary M, DO  lisinopril (ZESTRIL) 30 MG tablet Take 30 mg by mouth daily. 01/22/24   [provider]  magnesium gluconate (MAGONATE) 500 MG tablet Take 500 mg by mouth daily.    [provider]  Omega-3 Fatty Acids (FISH OIL) 1000 MG CAPS Take 1,000 mg by mouth daily.    [provider]  polyethylene glycol powder (GLYCOLAX/MIRALAX) 17 GM/SCOOP powder Take 17 g by mouth daily.    [provider]  rosuvastatin (CRESTOR) 20 MG tablet Take 20 mg by mouth daily. 01/22/24 07/20/24  [provider]  Vitamin D, Ergocalciferol, (DRISDOL) 1.25 MG (50000 UNIT) CAPS capsule Take 50,000 Units by mouth every 7 (seven) days. 12/18/23 03/05/24  [provider]  Vitamin D-Vitamin K (VITAMIN K2-VITAMIN D3 PO) Take by mouth daily.    [provider]    No current facility-administered medications for this encounter.   Current Outpatient Medications  Medication Sig Dispense Refill  famotidine (PEPCID) 20 MG tablet Take 20 mg by mouth 2 (two) times daily.     gabapentin  (NEURONTIN ) 100 MG capsule Take 1 capsule (100 mg total) by mouth at bedtime. (Patient not taking: Reported on 02/23/2024) 30 capsule 3   lisinopril (ZESTRIL) 30 MG tablet Take 30 mg by mouth daily.     magnesium gluconate (MAGONATE) 500 MG tablet Take 500 mg by mouth daily.     Omega-3 Fatty Acids (FISH OIL) 1000 MG CAPS Take 1,000 mg  by mouth daily.     polyethylene glycol powder (GLYCOLAX/MIRALAX) 17 GM/SCOOP powder Take 17 g by mouth daily.     rosuvastatin (CRESTOR) 20 MG tablet Take 20 mg by mouth daily.     Vitamin D, Ergocalciferol, (DRISDOL) 1.25 MG (50000 UNIT) CAPS capsule Take 50,000 Units by mouth every 7 (seven) days.     Vitamin D-Vitamin K (VITAMIN K2-VITAMIN D3 PO) Take by mouth daily.      Allergies as of 02/27/2024   (No Known Allergies)    History reviewed. No pertinent family history.  Social History   Socioeconomic History   Marital status: Widowed    Spouse name: Not on file   Number of children: 5   Years of education: Not on file   Highest education level: Not on file  Occupational History   Not on file  Tobacco Use   Smoking status: Never   Smokeless tobacco: Never  Vaping Use   Vaping status: Never Used  Substance and Sexual Activity   Alcohol use: No   Drug use: No   Sexual activity: Not on file  Other Topics Concern   Not on file  Social History Narrative   Not on file   Social Drivers of Health   Financial Resource Strain: Low Risk  (07/14/2023)   Received from [facility]   Overall Financial Resource Strain (CARDIA)    Difficulty of Paying Living Expenses: Not very hard  Food Insecurity: No Food Insecurity (07/14/2023)   Received from [facility]   Hunger Vital Sign    Within the past 12 months, you worried that your food would run out before you got the money to buy more.: Never true    Within the past 12 months, the food you bought just didn't last and you didn't have money to get more.: Never true  Transportation Needs: No Transportation Needs (07/14/2023)   Received from [facility] - Transportation    Lack of Transportation (Medical): No    Lack of Transportation (Non-Medical): No  Physical Activity: Sufficiently Active (07/14/2023)   Received from [facility]   Exercise Vital Sign    On average, how many days per week do you engage in  moderate to strenuous exercise (like a brisk walk)?: 7 days    On average, how many minutes do you engage in exercise at this level?: 30 min  Stress: No Stress Concern Present (07/14/2023)   Received from [facility] - Occupational Stress Questionnaire    Feeling of Stress : Not at all  Social Connections: Moderately Integrated (07/14/2023)   Received from [facility]   Social Network    How would you rate your social network (family, work, friends)?: Adequate participation with social networks  Intimate Partner Violence: Not At Risk (07/14/2023)   Received from [facility]   HITS    Over the last 12 months how often did your partner physically hurt you?: Never  Over the last 12 months how often did your partner insult you or talk down to you?: Never    Over the last 12 months how often did your partner threaten you with physical harm?: Never    Over the last 12 months how often did your partner scream or curse at you?: Never    Review of Systems: ROS is O/W negative except as mentioned in HPI.  Physical Exam: Vital signs in last 24 hours: Temp:  [98.4 F (36.9 C)] 98.4 F (36.9 C) (10/24 1530) Pulse Rate:  [69] 69 (10/24 1530) Resp:  [16] 16 (10/24 1530) BP: (132)/(53) 132/53 (10/24 1530) SpO2:  [100 %] 100 % (10/24 1530)   General:  Alert, Well-developed, well-nourished, pleasant and cooperative in NAD Head:  Normocephalic and atraumatic. Eyes: Slight scleral icterus noted. Ears:  Normal auditory acuity. Mouth:  No deformity or lesions.   Lungs:  Clear throughout to auscultation.  No wheezes, crackles, or rhonchi.  Heart:  Regular rate and rhythm; no murmurs, clicks, rubs, or gallops. Abdomen:  Soft, but somewhat distended.  Bowel sounds present.  Tenderness to palpation in the right upper quadrant. Msk:  Symmetrical without gross deformities. Pulses:  Normal pulses noted. Extremities:  Without clubbing or edema. Neurologic:   Alert and oriented x 4;  grossly normal neurologically. Skin:  Intact without significant lesions or rashes. Psych:  Alert and cooperative. Normal mood and affect.  IMPRESSION:  *Choledocholithiasis with a 7 mm calculus at the ampulla causing severe intra and extrahepatic biliary ductal dilatation with common bile duct measuring up to 1.6 cm.  Has been having right upper quadrant abdominal pain and nausea for a few months as well as intermittent fevers. *Moderate ascites and splenomegaly seen on MRI.  No comment on cirrhosis on imaging.  Platelets are normal.  Outpatient labs show a significantly elevated ANA titer and elevated IgG and IgA although AMA and anti-smooth muscle antibodies are normal.  PLAN: - Will plan for ERCP on 10/25.  She can have a normal diet now then n.p.o. after midnight.  She does not need any further/repeat imaging. - May need paracentesis, at least diagnostic to evaluate this ascites. - Trend labs/LFTs.  **Daughter was at the bedside and speaks English.  Discussed with her and she translated information to her mother.  All questions were answered.   Harlene BIRCH. Zehr  02/27/2024, 4:54 PM     Attending Physician's Attestation   I have taken an interval history, reviewed the chart and examined the patient.   This is a patient that the GI service is asked to you in the setting of obstructive jaundice with imaging showing evidence of choledocholithiasis recent outpatient workup initiated last week for significantly elevated LFTs.  MRCP was ordered and returned to look for a week.  He was found to have evidence of choledocholithiasis with significant biliary duct dilation.  There is also evidence of splenomegaly and ascites.  On physical exam, patient is stable.  She complains of generalized abdominal pain.  Etiology of her clinical symptoms is most likely related to choledocholithiasis but in the setting of her ascites and splenomegaly, chronic liver disease still must be  considered.  Workup ongoing with liver biochemical workup showing elevated immunoglobulins and ANA but normal anti-smooth muscle making autoimmune hepatitis seems less likely though not impossible.  The patient will benefit from ERCP to see if that will improve liver biochemical testing.  Patient will need a diagnostic paracentesis as well to help with working up for  etiology of potential chronic liver disease if SAAG is noted to show evidence of portal hypertension and is greater than 1.2.  Thankfully her platelets are normal.  Her INR only slightly elevated.  Her potassium was elevated on evening labs so we need to repeat those early on 10/25.  If she has significant hypokalemia, that we will need to be improved before patient can undergo anesthetic treatment.  A full upper endoscopy will be performed at same time of ERCP, to evaluate for potential varices.  The risks of an ERCP were discussed at length, including but not limited to the risk of perforation, bleeding, abdominal pain, post-ERCP pancreatitis (while usually mild can be severe and even life threatening).  The risks and benefits of endoscopic evaluation were discussed with the patient; these include but are not limited to the risk of perforation, infection, bleeding, missed lesions, lack of diagnosis, severe illness requiring hospitalization, as well as anesthesia and sedation related illnesses.  The patient and/or family is agreeable to proceed.  All patient and family questions were answered to the best of my ability, and the patient agrees to the aforementioned plan of action with follow-up as indicated.  Depending on anesthesia availability and electrolytes will hopefully be able to do her procedure tomorrow.     I agree with the Advanced Practitioner's note, impression, and recommendations with updates and my documentation as noted above.  The majority of the medical decision making/process, formulation of the impression/plan of action for the  patient were performed by me with substantive portion of this encounter (>50% time spent including complete performance of at least one of the key components of MDM, History, and/or Exam).   Aloha Finner, MD [facility] Advanced Endoscopy Office # [phone]

## 2024-02-27 NOTE — ED Triage Notes (Addendum)
 Pt was sent to ER by GI Dr. Due to a gallstone and needing admission and MRCP. MRI report from 10/21 states Choledocholithiasis, 0.7 cm calculus at the ampulla. Pt reporting abdominal swelling and pain.

## 2024-02-27 NOTE — ED Provider Triage Note (Signed)
 Emergency Medicine Provider Triage Evaluation Note  Madalin Hughart , a 83 y.o. female  was evaluated in triage.  Pt comes to ED for ERCP for chololithiasis. Has MRI yesterday notable for  Choledocholithiasis, 0.7 cm calculus at the ampulla. Severe intra and extrahepatic biliary ductal dilatation, the common bile duct measuring up to 1.6 cm. Cholecystectomy   Review of Systems  Positive: See hpi Negative:   Physical Exam  BP (!) 132/53 (BP Location: Left Arm)   Pulse 69   Temp 98.4 F (36.9 C) (Oral)   Resp 16   SpO2 100%  Gen:   Awake, no distress   Resp:  Normal effort  MSK:   Moves extremities without difficulty  Other:    Medical Decision Making  Medically screening exam initiated at 4:07 PM.  Appropriate orders placed.  Alex Mcmanigal was informed that the remainder of the evaluation will be completed by another provider, this initial triage assessment does not replace that evaluation, and the importance of remaining in the ED until their evaluation is complete.  Labs ordered   Minnie Tinnie BRAVO, [place] 02/27/24 1609

## 2024-02-27 NOTE — Consult Note (Incomplete Revision)
 Referring Provider: None Primary Care Physician:  Patient, No Pcp Per Primary Gastroenterologist:  Dr. Wilhelmenia  Reason for Consultation: Choledocholithiasis  HPI: Robin Hayes is a 83 y.o. female with past medical history of cholecystectomy, hypertension, hyperlipidemia who was recently seen in our office for evaluation of elevated liver enzymes.  She is also been having abdominal pain.  Please see note from 02/23/2024.  She had an ultrasound that showed a dilated common bile duct.  Outpatient MRI/MRCP was ordered and extensive liver serologies were ordered as well to rule out causes of chronic liver disease.  MRI was performed yesterday and as below showed a stone in her common bile duct at the level of the ampulla causing extensive biliary dilatation.  LFTs elevated with a total bili of about 5.  On her other evaluation ANA is quite elevated and IgG and IgA are elevated, but anti-smooth muscle antibody and AMA were normal.  She was contacted this morning to proceed to the emergency department to undergo ERCP for removal of the stone.  MRI abdomen/MRCP: IMPRESSION: 1. Choledocholithiasis, 0.7 cm calculus at the ampulla. 2. Severe intra and extrahepatic biliary ductal dilatation, the common bile duct measuring up to 1.6 cm in caliber. 3. Cholecystectomy. 4. Moderate volume ascites throughout the abdomen and pelvis. 5. Mild splenomegaly.   These results will be called to the ordering clinician or representative by the Radiologist Assistant, and communication documented in the PACS or [employer].   Aortic Atherosclerosis (ICD10-I70.0).  The daughter is at bedside.  She tells me that her mother's been having abdominal pain for a few months.  She has been having some fevers on and off as well.  She has been eating although not a lot.  She did have some oatmeal for breakfast and is hungry now, is wanting to eat.  Past Medical History:  Diagnosis Date   Heart burn    Hx of  migraines    Hypercholesteremia    Hypertension    Pancreatitis    Slow transit constipation     Past Surgical History:  Procedure Laterality Date   CESAREAN SECTION     CHOLECYSTECTOMY      Prior to Admission medications   Medication Sig Start Date End Date Taking? Authorizing Provider  famotidine (PEPCID) 20 MG tablet Take 20 mg by mouth 2 (two) times daily. 02/04/24   [provider]  gabapentin  (NEURONTIN ) 100 MG capsule Take 1 capsule (100 mg total) by mouth at bedtime. Patient not taking: Reported on 02/23/2024 06/21/15   Smith, Zachary M, DO  lisinopril (ZESTRIL) 30 MG tablet Take 30 mg by mouth daily. 01/22/24   [provider]  magnesium gluconate (MAGONATE) 500 MG tablet Take 500 mg by mouth daily.    [provider]  Omega-3 Fatty Acids (FISH OIL) 1000 MG CAPS Take 1,000 mg by mouth daily.    [provider]  polyethylene glycol powder (GLYCOLAX/MIRALAX) 17 GM/SCOOP powder Take 17 g by mouth daily.    [provider]  rosuvastatin (CRESTOR) 20 MG tablet Take 20 mg by mouth daily. 01/22/24 07/20/24  [provider]  Vitamin D, Ergocalciferol, (DRISDOL) 1.25 MG (50000 UNIT) CAPS capsule Take 50,000 Units by mouth every 7 (seven) days. 12/18/23 03/05/24  [provider]  Vitamin D-Vitamin K (VITAMIN K2-VITAMIN D3 PO) Take by mouth daily.    [provider]    No current facility-administered medications for this encounter.   Current Outpatient Medications  Medication Sig Dispense Refill  famotidine (PEPCID) 20 MG tablet Take 20 mg by mouth 2 (two) times daily.     gabapentin  (NEURONTIN ) 100 MG capsule Take 1 capsule (100 mg total) by mouth at bedtime. (Patient not taking: Reported on 02/23/2024) 30 capsule 3   lisinopril (ZESTRIL) 30 MG tablet Take 30 mg by mouth daily.     magnesium gluconate (MAGONATE) 500 MG tablet Take 500 mg by mouth daily.     Omega-3 Fatty Acids (FISH OIL) 1000 MG CAPS Take 1,000 mg  by mouth daily.     polyethylene glycol powder (GLYCOLAX/MIRALAX) 17 GM/SCOOP powder Take 17 g by mouth daily.     rosuvastatin (CRESTOR) 20 MG tablet Take 20 mg by mouth daily.     Vitamin D, Ergocalciferol, (DRISDOL) 1.25 MG (50000 UNIT) CAPS capsule Take 50,000 Units by mouth every 7 (seven) days.     Vitamin D-Vitamin K (VITAMIN K2-VITAMIN D3 PO) Take by mouth daily.      Allergies as of 02/27/2024   (No Known Allergies)    History reviewed. No pertinent family history.  Social History   Socioeconomic History   Marital status: Widowed    Spouse name: Not on file   Number of children: 5   Years of education: Not on file   Highest education level: Not on file  Occupational History   Not on file  Tobacco Use   Smoking status: Never   Smokeless tobacco: Never  Vaping Use   Vaping status: Never Used  Substance and Sexual Activity   Alcohol use: No   Drug use: No   Sexual activity: Not on file  Other Topics Concern   Not on file  Social History Narrative   Not on file   Social Drivers of Health   Financial Resource Strain: Low Risk  (07/14/2023)   Received from [facility]   Overall Financial Resource Strain (CARDIA)    Difficulty of Paying Living Expenses: Not very hard  Food Insecurity: No Food Insecurity (07/14/2023)   Received from [facility]   Hunger Vital Sign    Within the past 12 months, you worried that your food would run out before you got the money to buy more.: Never true    Within the past 12 months, the food you bought just didn't last and you didn't have money to get more.: Never true  Transportation Needs: No Transportation Needs (07/14/2023)   Received from [facility] - Transportation    Lack of Transportation (Medical): No    Lack of Transportation (Non-Medical): No  Physical Activity: Sufficiently Active (07/14/2023)   Received from [facility]   Exercise Vital Sign    On average, how many days per week do you engage in  moderate to strenuous exercise (like a brisk walk)?: 7 days    On average, how many minutes do you engage in exercise at this level?: 30 min  Stress: No Stress Concern Present (07/14/2023)   Received from [facility] - Occupational Stress Questionnaire    Feeling of Stress : Not at all  Social Connections: Moderately Integrated (07/14/2023)   Received from [facility]   Social Network    How would you rate your social network (family, work, friends)?: Adequate participation with social networks  Intimate Partner Violence: Not At Risk (07/14/2023)   Received from [facility]   HITS    Over the last 12 months how often did your partner physically hurt you?: Never  Over the last 12 months how often did your partner insult you or talk down to you?: Never    Over the last 12 months how often did your partner threaten you with physical harm?: Never    Over the last 12 months how often did your partner scream or curse at you?: Never    Review of Systems: ROS is O/W negative except as mentioned in HPI.  Physical Exam: Vital signs in last 24 hours: Temp:  [98.4 F (36.9 C)] 98.4 F (36.9 C) (10/24 1530) Pulse Rate:  [69] 69 (10/24 1530) Resp:  [16] 16 (10/24 1530) BP: (132)/(53) 132/53 (10/24 1530) SpO2:  [100 %] 100 % (10/24 1530)   General:  Alert, Well-developed, well-nourished, pleasant and cooperative in NAD Head:  Normocephalic and atraumatic. Eyes: Slight scleral icterus noted. Ears:  Normal auditory acuity. Mouth:  No deformity or lesions.   Lungs:  Clear throughout to auscultation.  No wheezes, crackles, or rhonchi.  Heart:  Regular rate and rhythm; no murmurs, clicks, rubs, or gallops. Abdomen:  Soft, but somewhat distended.  Bowel sounds present.  Tenderness to palpation in the right upper quadrant. Msk:  Symmetrical without gross deformities. Pulses:  Normal pulses noted. Extremities:  Without clubbing or edema. Neurologic:   Alert and oriented x 4;  grossly normal neurologically. Skin:  Intact without significant lesions or rashes. Psych:  Alert and cooperative. Normal mood and affect.  IMPRESSION:  *Choledocholithiasis with a 7 mm calculus at the ampulla causing severe intra and extrahepatic biliary ductal dilatation with common bile duct measuring up to 1.6 cm.  Has been having right upper quadrant abdominal pain and nausea for a few months as well as intermittent fevers. *Moderate ascites and splenomegaly seen on MRI.  No comment on cirrhosis on imaging.  Platelets are normal.  Outpatient labs show a significantly elevated ANA titer and elevated IgG and IgA although AMA and anti-smooth muscle antibodies are normal.  PLAN: - Will plan for ERCP on 10/25.  She can have a normal diet now then n.p.o. after midnight.  She does not need any further/repeat imaging. - May need paracentesis, at least diagnostic to evaluate this ascites. - Trend labs/LFTs.  **Daughter was at the bedside and speaks [place].  Discussed with her and she translated information to her mother.  All questions were answered.   Harlene BIRCH. Zehr  02/27/2024, 4:54 PM     Attending Physician's Attestation   I have taken an interval history, reviewed the chart and examined the patient.   ***  I agree with the Advanced Practitioner's note, impression, and recommendations with updates and my documentation as noted above.  The majority of the medical decision making/process, formulation of the impression/plan of action for the patient were performed by me with substantive portion of this encounter (>50% time spent including complete performance of at least one of the key components of MDM, History, and/or Exam).   Aloha Finner, MD [facility] Advanced Endoscopy Office # [phone]

## 2024-02-27 NOTE — Telephone Encounter (Signed)
 I was able to speak with the pts daughter and make her aware that the pt needs to go to the ED for admission to have ERCP for bile duct stone.    She will take the pt to [facility] now as recommended.

## 2024-02-27 NOTE — ED Provider Notes (Signed)
 [facility] AT [facility] Provider Note   CSN: [id number] Arrival date & time: 02/27/24  1440     Patient presents with: Abdominal Pain   Robin Hayes is a 83 y.o. female with history of cholecystectomy presents with complaints of ongoing right upper quadrant abdominal pain for the past few months.  Pain is worse with eating.  Endorses nausea without vomiting or diarrhea.  Has been followed with [facility] due to elevated liver enzymes and an outpatient ultrasound demonstrated dilated CBD.  Patient had a abdominal MRI recently outpatient which demonstrated choledocholithiasis that is 0.7 cm calculus and severe intra and extrahepatic biliary ductal dilation.    Abdominal Pain     Past Medical History:  Diagnosis Date   Heart burn    Hx of migraines    Hypercholesteremia    Hypertension    Pancreatitis    Slow transit constipation    Past Surgical History:  Procedure Laterality Date   CESAREAN SECTION     CHOLECYSTECTOMY       Prior to Admission medications   Medication Sig Start Date End Date Taking? Authorizing Provider  famotidine (PEPCID) 20 MG tablet Take 20 mg by mouth 2 (two) times daily. 02/04/24  Yes [provider]  lisinopril (ZESTRIL) 30 MG tablet Take 30 mg by mouth daily. 01/22/24  Yes [provider]  magnesium gluconate (MAGONATE) 500 MG tablet Take 500 mg by mouth daily.   Yes [provider]  Omega-3 Fatty Acids (FISH OIL) 1000 MG CAPS Take 1,000 mg by mouth daily.   Yes [provider]  polyethylene glycol powder (GLYCOLAX/MIRALAX) 17 GM/SCOOP powder Take 17 g by mouth daily.   Yes [provider]  rosuvastatin (CRESTOR) 20 MG tablet Take 20 mg by mouth daily. 01/22/24 07/20/24 Yes [provider]  Vitamin D, Ergocalciferol, (DRISDOL) 1.25 MG (50000 UNIT) CAPS capsule Take 50,000 Units by mouth every 7 (seven) days. 12/18/23 03/05/24 Yes [provider]  Vitamin  D-Vitamin K (VITAMIN K2-VITAMIN D3 PO) Take by mouth daily.   Yes [provider]  gabapentin  (NEURONTIN ) 100 MG capsule Take 1 capsule (100 mg total) by mouth at bedtime. Patient not taking: No sig reported 06/21/15   Claudene Arthea HERO, DO    Allergies: Patient has no known allergies.    Review of Systems  Gastrointestinal:  Positive for abdominal pain.    Updated Vital Signs BP (!) 103/48 (BP Location: Right Arm)   Pulse 71   Temp 98.1 F (36.7 C)   Resp 18   Ht 4' 10.5 (1.486 m)   Wt 68.8 kg   SpO2 99%   BMI 31.16 kg/m   Physical Exam Vitals and nursing note reviewed.  Constitutional:      General: She is not in acute distress.    Appearance: She is well-developed.  HENT:     Head: Normocephalic and atraumatic.  Eyes:     Conjunctiva/sclera: Conjunctivae normal.  Cardiovascular:     Rate and Rhythm: Normal rate and regular rhythm.     Heart sounds: No murmur heard. Pulmonary:     Effort: Pulmonary effort is normal. No respiratory distress.     Breath sounds: Normal breath sounds.  Abdominal:     Tenderness: There is abdominal tenderness.     Comments: Tenderness worse to the right upper quadrant, abdomen is distended, not peritoneal   Musculoskeletal:        General: No swelling.     Cervical back:  Neck supple.  Skin:    General: Skin is warm and dry.     Capillary Refill: Capillary refill takes less than 2 seconds.  Neurological:     Mental Status: She is alert.  Psychiatric:        Mood and Affect: Mood normal.     (all labs ordered are listed, but only abnormal results are displayed) Labs Reviewed  LIPASE, BLOOD - Abnormal; Notable for the following components:      Result Value   Lipase 10 (*)    All other components within normal limits  COMPREHENSIVE METABOLIC PANEL WITH GFR - Abnormal; Notable for the following components:   Potassium 5.2 (*)    Glucose, Bld 100 (*)    Calcium 8.8 (*)    Albumin 3.0 (*)    AST 262 (*)    ALT 96 (*)     Alkaline Phosphatase 1,015 (*)    Total Bilirubin 4.9 (*)    All other components within normal limits  URINALYSIS, ROUTINE W REFLEX MICROSCOPIC - Abnormal; Notable for the following components:   Color, Urine AMBER (*)    APPearance HAZY (*)    Bilirubin Urine MODERATE (*)    All other components within normal limits  CBC - Abnormal; Notable for the following components:   RBC 3.13 (*)    Hemoglobin 9.5 (*)    HCT 30.4 (*)    Platelets 136 (*)    All other components within normal limits  COMPREHENSIVE METABOLIC PANEL WITH GFR - Abnormal; Notable for the following components:   Calcium 7.6 (*)    Total Protein 5.3 (*)    Albumin 2.1 (*)    AST 161 (*)    ALT 61 (*)    Alkaline Phosphatase 687 (*)    Total Bilirubin 2.6 (*)    All other components within normal limits  PROTIME-INR - Abnormal; Notable for the following components:   Prothrombin Time 16.9 (*)    INR 1.3 (*)    All other components within normal limits  CBC    EKG: None  Radiology: No results found.   Procedures   Medications Ordered in the ED  0.9 %  sodium chloride infusion ( Intravenous New Bag/Given 02/29/24 0521)  0.9 %  sodium chloride infusion (0 mLs Intravenous Stopped 02/28/24 1100)  acetaminophen (TYLENOL) tablet 650 mg (has no administration in time range)    Or  acetaminophen (TYLENOL) suppository 650 mg (has no administration in time range)  ondansetron (ZOFRAN) tablet 4 mg (has no administration in time range)    Or  ondansetron (ZOFRAN) injection 4 mg (has no administration in time range)  polyethylene glycol (MIRALAX / GLYCOLAX) packet 17 g (has no administration in time range)  0.9 %  sodium chloride infusion (0 mLs Intravenous Stopped 02/29/24 0514)  famotidine (PEPCID) tablet 20 mg (20 mg Oral Given 02/28/24 2125)  guaiFENesin-dextromethorphan (ROBITUSSIN DM) 100-10 MG/5ML syrup 5 mL (5 mLs Oral Given 02/28/24 1817)  melatonin tablet 5 mg (has no administration in time range)   phytonadione (VITAMIN K) tablet 10 mg (10 mg Oral Given 02/28/24 0517)    Clinical Course as of 02/29/24 0651  Fri Feb 27, 2024  1822 Patient evaluated for ongoing abdominal pain for the past few months that is associated with nausea without vomiting.  She is hemodynamically stable.  On exam her abdomen is distended, she has tenderness to the right upper quadrant.  Had outpatient MRI that was consistent with choledocholithiasis.  Referred here  by GI.  Plan is for ERCP tomorrow.  Lab work is notable for transaminitis, alk phos of greater than thousand and total bili of 4.9.  CBC within normal limit [JT]  1910 Discussed patient with Dr. Wilhelmenia, recommended admission to medicine.  Hold any blood thinners.  N.p.o. at midnight.  Plan for ERCP tomorrow [JT]  1938 Discussed patient with Hospitalist Dr. Pearlean, agreed for admission [JT]    Clinical Course User Index [JT] Donnajean Lynwood DEL, PA-C                                 Medical Decision Making Amount and/or Complexity of Data Reviewed Labs: ordered.  Risk Decision regarding hospitalization.   This patient presents to the ED with chief complaint(s) of Abdominal pain .  The complaint involves an extensive differential diagnosis and also carries with it a high risk of complications and morbidity.   Pertinent past medical history as listed in HPI   Additional history obtained: Additional history obtained from family Records reviewed Care Everywhere/External Records  Disposition:   Patient will be admitted for further workup and management  Social Determinants of Health:   none  This note was dictated with voice recognition software.  Despite best efforts at proofreading, errors may have occurred which can change the documentation meaning.       Final diagnoses:  Choledocholithiasis    ED Discharge Orders     None          Donnajean Lynwood DEL, PA-C 02/29/24 9348    Mannie Pac T, DO 02/29/24 1535

## 2024-02-27 NOTE — Telephone Encounter (Signed)
-----   Message from [facility] sent at 02/26/2024  5:08 PM EDT ----- JZ, As discussed with her bilirubin elevation and with the findings of a stone she will need ERCP. Recommend that she come into the hospital for expedited ERCP.  Pod C Rns, Please coordinate with JZ discussion with patient.  She can come to [facility] for likely ERCP this weekend. GM ----- Message ----- From: Cayetano Harlene BIRCH, PA-C Sent: 02/26/2024  12:11 PM EDT To: Aloha Wilhelmenia Raddle., MD  Patient of Jennifer's.  Has a stone in bile duct on MRI.  Looks like she reported RUQ abdominal pain ongoing since March and elevated LFTs for some time as well.  Does she need to come to the  hospital to have this addressed since likely would be a while for outpatient management?  Thank you,  Jess  ----- Message ----- From: Interface, Rad Results In Sent: 02/26/2024  11:47 AM EDT To: Delon Hendricks Failing, PA

## 2024-02-27 NOTE — H&P (Addendum)
 History and Physical    Robin Hayes FMW:[id number] DOB: [birth year] DOA: 02/27/2024  PCP: Patient, No Pcp Per   Patient coming from: Home  I have personally briefly reviewed patient's old medical records in [facility]  Chief Complaint: Abdominal pain  HPI: Robin Hayes is a 83 y.o. female with medical history significant for hypertension.  Patient is Spanish-speaking.  Her daughter-Vivian is at bedside and assists with the history. Patient presented to the ED with complaints of waxing and waning but persistent abdominal pain that started at least 3 months ago.  She has felt nauseous, but no vomiting or diarrhea.  Reports poor oral intake and at least 10lb weight loss also since onset.  She saw her outpatient provider, had elevated liver enzymes, outpatient ultrasound showed dilated CBD, and was referred to GI appointment 10/20.  MRCP done showed choledocholithiasis, 0.7 cm calculus at the ampulla.  Severe intra and extrahepatic biliary ductal dilatation.  She also had serologies done. She was referred to the ED today.  ED Course: Stable vitals.  Tmax 99.6.  WBC 9.3.  AST 262, ALT 96, ALP 1015, T. bili 4.9. GI consulted, plan for ERCP tomorrow.  N.p.o. midnight.  No further imaging needed.  Review of Systems: As per HPI all other systems reviewed and negative.  Past Medical History:  Diagnosis Date   Heart burn    Hx of migraines    Hypercholesteremia    Hypertension    Pancreatitis    Slow transit constipation     Past Surgical History:  Procedure Laterality Date   CESAREAN SECTION     CHOLECYSTECTOMY       reports that she has never smoked. She has never used smokeless tobacco. She reports that she does not drink alcohol and does not use drugs.  No Known Allergies  Family history of hypertension.  Prior to Admission medications   Medication Sig Start Date End Date Taking? Authorizing Provider  famotidine (PEPCID) 20 MG tablet Take 20 mg by mouth 2 (two)  times daily. 02/04/24   [provider]  gabapentin  (NEURONTIN ) 100 MG capsule Take 1 capsule (100 mg total) by mouth at bedtime. Patient not taking: Reported on 02/23/2024 06/21/15   Smith, Zachary M, DO  lisinopril (ZESTRIL) 30 MG tablet Take 30 mg by mouth daily. 01/22/24   [provider]  magnesium gluconate (MAGONATE) 500 MG tablet Take 500 mg by mouth daily.    [provider]  Omega-3 Fatty Acids (FISH OIL) 1000 MG CAPS Take 1,000 mg by mouth daily.    [provider]  polyethylene glycol powder (GLYCOLAX/MIRALAX) 17 GM/SCOOP powder Take 17 g by mouth daily.    [provider]  rosuvastatin (CRESTOR) 20 MG tablet Take 20 mg by mouth daily. 01/22/24 07/20/24  [provider]  Vitamin D, Ergocalciferol, (DRISDOL) 1.25 MG (50000 UNIT) CAPS capsule Take 50,000 Units by mouth every 7 (seven) days. 12/18/23 03/05/24  [provider]  Vitamin D-Vitamin K (VITAMIN K2-VITAMIN D3 PO) Take by mouth daily.    [provider]    Physical Exam: Vitals:   02/27/24 1530 02/27/24 1914 02/27/24 2055  BP: (!) 132/53 (!) 138/58 (!) 127/50  Pulse: 69 74 80  Resp: 16 17 20   Temp: 98.4 F (36.9 C) 99.6 F (37.6 C) 99.7 F (37.6 C)  TempSrc: Oral Oral   SpO2: 100% 96% 95%    Constitutional: NAD, calm, comfortable Vitals:   02/27/24 1530 02/27/24 1914 02/27/24 2055  BP: (!) 132/53 ROLLEN)  138/58 (!) 127/50  Pulse: 69 74 80  Resp: 16 17 20   Temp: 98.4 F (36.9 C) 99.6 F (37.6 C) 99.7 F (37.6 C)  TempSrc: Oral Oral   SpO2: 100% 96% 95%   Eyes: PERRL, lids and conjunctivae normal ENMT: Mucous membranes are moist. Posterior pharynx clear of any exudate or lesions.Normal dentition.  Neck: normal, supple, no masses, no thyromegaly Respiratory: clear to auscultation bilaterally, no wheezing, no crackles. Normal respiratory effort. No accessory muscle use.  Cardiovascular: Regular rate and rhythm, no murmurs / rubs / gallops. No  extremity edema.  Extremities warm. Abdomen: no tenderness, no masses palpated. No hepatosplenomegaly. Bowel sounds positive.  Musculoskeletal: no clubbing / cyanosis. No joint deformity upper and lower extremities. Skin: no rashes, lesions, ulcers. No induration Neurologic: No facial asymmetry, moves extremities spontaneously, speech fluent.  Psychiatric: Normal judgment and insight. Alert and oriented x 3. Normal mood.   Labs on Admission: I have personally reviewed following labs and imaging studies  CBC: Recent Labs  Lab 02/23/24 1201 02/27/24 1715  WBC 7.4 9.3  NEUTROABS 5.0  --   HGB 13.0 13.3  HCT 38.8 41.6  MCV 93.7 96.7  PLT 235.0 250   Basic Metabolic Panel: Recent Labs  Lab 02/23/24 1201 02/27/24 1715  NA 136 136  K 3.6 5.2*  CL 102 103  CO2 25 23  GLUCOSE 120* 100*  BUN 14 15  CREATININE 0.63 0.76  CALCIUM 8.3* 8.8*   GFR: Estimated Creatinine Clearance: 44.7 mL/min (by C-G formula based on SCr of 0.76 mg/dL). Liver Function Tests: Recent Labs  Lab 02/23/24 1201 02/27/24 1715  AST 195* 262*  ALT 76* 96*  ALKPHOS 893* 1,015*  BILITOT 4.8* 4.9*  PROT 7.2 7.5  ALBUMIN 2.9* 3.0*   Recent Labs  Lab 02/23/24 1201 02/27/24 1715  LIPASE 7.0* 10*   No results for input(s): AMMONIA in the last 168 hours. Coagulation Profile: Recent Labs  Lab 02/23/24 1201  INR 1.3*   Urine analysis:    Component Value Date/Time   COLORURINE AMBER (A) 02/27/2024 1728   APPEARANCEUR HAZY (A) 02/27/2024 1728   LABSPEC 1.025 02/27/2024 1728   PHURINE 5.0 02/27/2024 1728   GLUCOSEU NEGATIVE 02/27/2024 1728   HGBUR NEGATIVE 02/27/2024 1728   BILIRUBINUR MODERATE (A) 02/27/2024 1728   KETONESUR NEGATIVE 02/27/2024 1728   PROTEINUR NEGATIVE 02/27/2024 1728   NITRITE NEGATIVE 02/27/2024 1728   LEUKOCYTESUR NEGATIVE 02/27/2024 1728    Radiological Exams on Admission: No results found.  EKG: None.   Assessment/Plan Principal Problem:    Choledocholithiasis Active Problems:   HTN (hypertension)   Dyslipidemia  Assessment and Plan:  Choledocholithiasis- Choledocholithiasis, 0.7 cm calculus at the ampulla. Severe intra and extrahepatic biliary ductal dilatation, the common bile duct measuring up to 1.6 cm in caliber.  Afebrile.  WBC 9.3.  AST 262, ALT 96, ALP 1015, T. bili 4.9.  No suggestion of infection at this time.  Patient also had serology labs done at outside-ANA, IgG, IgA elevated, normal anti-smooth muscle and normal AMA. - EDP talked with Dr. Mansouraty-plan for ERCP tomorrow.  May need paracentesis diagnostic, to evaluate ascites - N.p.o. midnight - N/s 100cc/hr x 10hrs - Hold Crestor - Pre-procedure EKG  Ascites-of unknown etiology.  Per GI- May need paracentesis diagnostic, to evaluate ascites.  No other sign of volume overload.  Hyperkalemia 5.3. - Hold lisinopril for now  Hypertension-stable. - Holding lisinopril for now with hyperkalemia.  Dyslipidemia- -hold Crestor with elevated liver enzymes.  DVT prophylaxis: SCDS Code Status: DNR-confirmed with patient and daughter Vivian at bedside. Family Communication: Daughter Vivian at bedside Disposition Plan:  ~ 2 days Consults called: GI Admission status: Inpt med surg I certify that at the point of admission it is my clinical judgment that the patient will require inpatient hospital care spanning beyond 2 midnights from the point of admission due to high intensity of service, high risk for further deterioration and high frequency of surveillance required.   Author: Tully FORBES Carwin, MD 02/27/2024 9:29 PM  For on call review [email or web address].

## 2024-02-27 NOTE — H&P (View-Only) (Signed)
 Referring Provider: None Primary Care Physician:  Patient, No Pcp Per Primary Gastroenterologist:  Dr. Wilhelmenia  Reason for Consultation: Choledocholithiasis  HPI: Robin Hayes is a 83 y.o. female with past medical history of cholecystectomy, hypertension, hyperlipidemia who was recently seen in our office for evaluation of elevated liver enzymes.  She is also been having abdominal pain.  Please see note from 02/23/2024.  She had an ultrasound that showed a dilated common bile duct.  Outpatient MRI/MRCP was ordered and extensive liver serologies were ordered as well to rule out causes of chronic liver disease.  MRI was performed yesterday and as below showed a stone in her common bile duct at the level of the ampulla causing extensive biliary dilatation.  LFTs elevated with a total bili of about 5.  On her other evaluation ANA is quite elevated and IgG and IgA are elevated, but anti-smooth muscle antibody and AMA were normal.  She was contacted this morning to proceed to the emergency department to undergo ERCP for removal of the stone.  MRI abdomen/MRCP: IMPRESSION: 1. Choledocholithiasis, 0.7 cm calculus at the ampulla. 2. Severe intra and extrahepatic biliary ductal dilatation, the common bile duct measuring up to 1.6 cm in caliber. 3. Cholecystectomy. 4. Moderate volume ascites throughout the abdomen and pelvis. 5. Mild splenomegaly.   These results will be called to the ordering clinician or representative by the Radiologist Assistant, and communication documented in the PACS or [employer].   Aortic Atherosclerosis (ICD10-I70.0).  The daughter is at bedside.  She tells me that her mother's been having abdominal pain for a few months.  She has been having some fevers on and off as well.  She has been eating although not a lot.  She did have some oatmeal for breakfast and is hungry now, is wanting to eat.  Past Medical History:  Diagnosis Date   Heart burn    Hx of  migraines    Hypercholesteremia    Hypertension    Pancreatitis    Slow transit constipation     Past Surgical History:  Procedure Laterality Date   CESAREAN SECTION     CHOLECYSTECTOMY      Prior to Admission medications   Medication Sig Start Date End Date Taking? Authorizing Provider  famotidine (PEPCID) 20 MG tablet Take 20 mg by mouth 2 (two) times daily. 02/04/24   [provider]  gabapentin  (NEURONTIN ) 100 MG capsule Take 1 capsule (100 mg total) by mouth at bedtime. Patient not taking: Reported on 02/23/2024 06/21/15   Smith, Zachary M, DO  lisinopril (ZESTRIL) 30 MG tablet Take 30 mg by mouth daily. 01/22/24   [provider]  magnesium gluconate (MAGONATE) 500 MG tablet Take 500 mg by mouth daily.    [provider]  Omega-3 Fatty Acids (FISH OIL) 1000 MG CAPS Take 1,000 mg by mouth daily.    [provider]  polyethylene glycol powder (GLYCOLAX/MIRALAX) 17 GM/SCOOP powder Take 17 g by mouth daily.    [provider]  rosuvastatin (CRESTOR) 20 MG tablet Take 20 mg by mouth daily. 01/22/24 07/20/24  [provider]  Vitamin D, Ergocalciferol, (DRISDOL) 1.25 MG (50000 UNIT) CAPS capsule Take 50,000 Units by mouth every 7 (seven) days. 12/18/23 03/05/24  [provider]  Vitamin D-Vitamin K (VITAMIN K2-VITAMIN D3 PO) Take by mouth daily.    [provider]    No current facility-administered medications for this encounter.   Current Outpatient Medications  Medication Sig Dispense Refill  famotidine (PEPCID) 20 MG tablet Take 20 mg by mouth 2 (two) times daily.     gabapentin  (NEURONTIN ) 100 MG capsule Take 1 capsule (100 mg total) by mouth at bedtime. (Patient not taking: Reported on 02/23/2024) 30 capsule 3   lisinopril (ZESTRIL) 30 MG tablet Take 30 mg by mouth daily.     magnesium gluconate (MAGONATE) 500 MG tablet Take 500 mg by mouth daily.     Omega-3 Fatty Acids (FISH OIL) 1000 MG CAPS Take 1,000 mg  by mouth daily.     polyethylene glycol powder (GLYCOLAX/MIRALAX) 17 GM/SCOOP powder Take 17 g by mouth daily.     rosuvastatin (CRESTOR) 20 MG tablet Take 20 mg by mouth daily.     Vitamin D, Ergocalciferol, (DRISDOL) 1.25 MG (50000 UNIT) CAPS capsule Take 50,000 Units by mouth every 7 (seven) days.     Vitamin D-Vitamin K (VITAMIN K2-VITAMIN D3 PO) Take by mouth daily.      Allergies as of 02/27/2024   (No Known Allergies)    History reviewed. No pertinent family history.  Social History   Socioeconomic History   Marital status: Widowed    Spouse name: Not on file   Number of children: 5   Years of education: Not on file   Highest education level: Not on file  Occupational History   Not on file  Tobacco Use   Smoking status: Never   Smokeless tobacco: Never  Vaping Use   Vaping status: Never Used  Substance and Sexual Activity   Alcohol use: No   Drug use: No   Sexual activity: Not on file  Other Topics Concern   Not on file  Social History Narrative   Not on file   Social Drivers of Health   Financial Resource Strain: Low Risk  (07/14/2023)   Received from [facility]   Overall Financial Resource Strain (CARDIA)    Difficulty of Paying Living Expenses: Not very hard  Food Insecurity: No Food Insecurity (07/14/2023)   Received from [facility]   Hunger Vital Sign    Within the past 12 months, you worried that your food would run out before you got the money to buy more.: Never true    Within the past 12 months, the food you bought just didn't last and you didn't have money to get more.: Never true  Transportation Needs: No Transportation Needs (07/14/2023)   Received from [facility] - Transportation    Lack of Transportation (Medical): No    Lack of Transportation (Non-Medical): No  Physical Activity: Sufficiently Active (07/14/2023)   Received from [facility]   Exercise Vital Sign    On average, how many days per week do you engage in  moderate to strenuous exercise (like a brisk walk)?: 7 days    On average, how many minutes do you engage in exercise at this level?: 30 min  Stress: No Stress Concern Present (07/14/2023)   Received from [facility] - Occupational Stress Questionnaire    Feeling of Stress : Not at all  Social Connections: Moderately Integrated (07/14/2023)   Received from [facility]   Social Network    How would you rate your social network (family, work, friends)?: Adequate participation with social networks  Intimate Partner Violence: Not At Risk (07/14/2023)   Received from [facility]   HITS    Over the last 12 months how often did your partner physically hurt you?: Never  Over the last 12 months how often did your partner insult you or talk down to you?: Never    Over the last 12 months how often did your partner threaten you with physical harm?: Never    Over the last 12 months how often did your partner scream or curse at you?: Never    Review of Systems: ROS is O/W negative except as mentioned in HPI.  Physical Exam: Vital signs in last 24 hours: Temp:  [98.4 F (36.9 C)] 98.4 F (36.9 C) (10/24 1530) Pulse Rate:  [69] 69 (10/24 1530) Resp:  [16] 16 (10/24 1530) BP: (132)/(53) 132/53 (10/24 1530) SpO2:  [100 %] 100 % (10/24 1530)   General:  Alert, Well-developed, well-nourished, pleasant and cooperative in NAD Head:  Normocephalic and atraumatic. Eyes: Slight scleral icterus noted. Ears:  Normal auditory acuity. Mouth:  No deformity or lesions.   Lungs:  Clear throughout to auscultation.  No wheezes, crackles, or rhonchi.  Heart:  Regular rate and rhythm; no murmurs, clicks, rubs, or gallops. Abdomen:  Soft, but somewhat distended.  Bowel sounds present.  Tenderness to palpation in the right upper quadrant. Msk:  Symmetrical without gross deformities. Pulses:  Normal pulses noted. Extremities:  Without clubbing or edema. Neurologic:   Alert and oriented x 4;  grossly normal neurologically. Skin:  Intact without significant lesions or rashes. Psych:  Alert and cooperative. Normal mood and affect.  IMPRESSION:  *Choledocholithiasis with a 7 mm calculus at the ampulla causing severe intra and extrahepatic biliary ductal dilatation with common bile duct measuring up to 1.6 cm.  Has been having right upper quadrant abdominal pain and nausea for a few months as well as intermittent fevers. *Moderate ascites and splenomegaly seen on MRI.  No comment on cirrhosis on imaging.  Platelets are normal.  Outpatient labs show a significantly elevated ANA titer and elevated IgG and IgA although AMA and anti-smooth muscle antibodies are normal.  PLAN: - Will plan for ERCP on 10/25.  She can have a normal diet now then n.p.o. after midnight.  She does not need any further/repeat imaging. - May need paracentesis, at least diagnostic to evaluate this ascites. - Trend labs/LFTs.  **Daughter was at the bedside and speaks English.  Discussed with her and she translated information to her mother.  All questions were answered.   Harlene BIRCH. Zehr  02/27/2024, 4:54 PM     Attending Physician's Attestation   I have taken an interval history, reviewed the chart and examined the patient.   This is a patient that the GI service is asked to you in the setting of obstructive jaundice with imaging showing evidence of choledocholithiasis recent outpatient workup initiated last week for significantly elevated LFTs.  MRCP was ordered and returned to look for a week.  He was found to have evidence of choledocholithiasis with significant biliary duct dilation.  There is also evidence of splenomegaly and ascites.  On physical exam, patient is stable.  She complains of generalized abdominal pain.  Etiology of her clinical symptoms is most likely related to choledocholithiasis but in the setting of her ascites and splenomegaly, chronic liver disease still must be  considered.  Workup ongoing with liver biochemical workup showing elevated immunoglobulins and ANA but normal anti-smooth muscle making autoimmune hepatitis seems less likely though not impossible.  The patient will benefit from ERCP to see if that will improve liver biochemical testing.  Patient will need a diagnostic paracentesis as well to help with working up for  etiology of potential chronic liver disease if SAAG is noted to show evidence of portal hypertension and is greater than 1.2.  Thankfully her platelets are normal.  Her INR only slightly elevated.  Her potassium was elevated on evening labs so we need to repeat those early on 10/25.  If she has significant hypokalemia, that we will need to be improved before patient can undergo anesthetic treatment.  A full upper endoscopy will be performed at same time of ERCP, to evaluate for potential varices.  The risks of an ERCP were discussed at length, including but not limited to the risk of perforation, bleeding, abdominal pain, post-ERCP pancreatitis (while usually mild can be severe and even life threatening).  The risks and benefits of endoscopic evaluation were discussed with the patient; these include but are not limited to the risk of perforation, infection, bleeding, missed lesions, lack of diagnosis, severe illness requiring hospitalization, as well as anesthesia and sedation related illnesses.  The patient and/or family is agreeable to proceed.  All patient and family questions were answered to the best of my ability, and the patient agrees to the aforementioned plan of action with follow-up as indicated.  Depending on anesthesia availability and electrolytes will hopefully be able to do her procedure tomorrow.     I agree with the Advanced Practitioner's note, impression, and recommendations with updates and my documentation as noted above.  The majority of the medical decision making/process, formulation of the impression/plan of action for the  patient were performed by me with substantive portion of this encounter (>50% time spent including complete performance of at least one of the key components of MDM, History, and/or Exam).   Aloha Finner, MD [facility] Advanced [facility]oscopy Office # [phone]

## 2024-02-28 ENCOUNTER — Encounter (HOSPITAL_COMMUNITY): Payer: Self-pay | Admitting: Internal Medicine

## 2024-02-28 DIAGNOSIS — K769 Liver disease, unspecified: Secondary | ICD-10-CM

## 2024-02-28 DIAGNOSIS — K8050 Calculus of bile duct without cholangitis or cholecystitis without obstruction: Secondary | ICD-10-CM | POA: Diagnosis not present

## 2024-02-28 DIAGNOSIS — K7460 Unspecified cirrhosis of liver: Secondary | ICD-10-CM

## 2024-02-28 LAB — CBC
HCT: 30.4 % — ABNORMAL LOW (ref 36.0–46.0)
Hemoglobin: 9.5 g/dL — ABNORMAL LOW (ref 12.0–15.0)
MCH: 30.4 pg (ref 26.0–34.0)
MCHC: 31.3 g/dL (ref 30.0–36.0)
MCV: 97.1 fL (ref 80.0–100.0)
Platelets: 136 K/uL — ABNORMAL LOW (ref 150–400)
RBC: 3.13 MIL/uL — ABNORMAL LOW (ref 3.87–5.11)
RDW: 15.3 % (ref 11.5–15.5)
WBC: 5.9 K/uL (ref 4.0–10.5)
nRBC: 0 % (ref 0.0–0.2)

## 2024-02-28 LAB — PROTIME-INR
INR: 1.3 — ABNORMAL HIGH (ref 0.8–1.2)
Prothrombin Time: 16.9 s — ABNORMAL HIGH (ref 11.4–15.2)

## 2024-02-28 LAB — COMPREHENSIVE METABOLIC PANEL WITH GFR
ALT: 61 U/L — ABNORMAL HIGH (ref 0–44)
AST: 161 U/L — ABNORMAL HIGH (ref 15–41)
Albumin: 2.1 g/dL — ABNORMAL LOW (ref 3.5–5.0)
Alkaline Phosphatase: 687 U/L — ABNORMAL HIGH (ref 38–126)
Anion gap: 7 (ref 5–15)
BUN: 15 mg/dL (ref 8–23)
CO2: 22 mmol/L (ref 22–32)
Calcium: 7.6 mg/dL — ABNORMAL LOW (ref 8.9–10.3)
Chloride: 107 mmol/L (ref 98–111)
Creatinine, Ser: 0.63 mg/dL (ref 0.44–1.00)
GFR, Estimated: >60 mL/min (ref >60)
Glucose, Bld: 90 mg/dL (ref 70–99)
Potassium: 4.4 mmol/L (ref 3.5–5.1)
Sodium: 135 mmol/L (ref 135–145)
Total Bilirubin: 2.6 mg/dL — ABNORMAL HIGH (ref 0.0–1.2)
Total Protein: 5.3 g/dL — ABNORMAL LOW (ref 6.5–8.1)

## 2024-02-28 MED ORDER — EPINEPHRINE 1 MG/10ML IV SOSY
PREFILLED_SYRINGE | INTRAVENOUS | Status: AC
  Filled 2024-02-28: qty 10

## 2024-02-28 MED ORDER — SODIUM CHLORIDE 0.9 % IV SOLN: INTRAVENOUS | Status: AC

## 2024-02-28 MED ORDER — FAMOTIDINE 20 MG PO TABS
20.0000 mg | ORAL_TABLET | Freq: Two times a day (BID) | ORAL | Status: DC
  Administered 2024-02-28 – 2024-03-01 (×4): 20 mg via ORAL
  Filled 2024-02-28 (×5): qty 1

## 2024-02-28 MED ORDER — GUAIFENESIN-DM 100-10 MG/5ML PO SYRP
5.0000 mL | ORAL_SOLUTION | ORAL | Status: DC | PRN
  Administered 2024-02-28: 5 mL via ORAL
  Filled 2024-02-28: qty 5

## 2024-02-28 MED ORDER — MELATONIN 5 MG PO TABS
5.0000 mg | ORAL_TABLET | Freq: Every evening | ORAL | Status: DC | PRN
  Filled 2024-02-28: qty 1

## 2024-02-28 MED ORDER — CIPROFLOXACIN IN D5W 400 MG/200ML IV SOLN
INTRAVENOUS | Status: AC
  Filled 2024-02-28: qty 200

## 2024-02-28 MED ORDER — GLUCAGON HCL RDNA (DIAGNOSTIC) 1 MG IJ SOLR
INTRAMUSCULAR | Status: AC
  Filled 2024-02-28: qty 2

## 2024-02-28 MED ORDER — PHYTONADIONE 5 MG PO TABS
10.0000 mg | ORAL_TABLET | Freq: Once | ORAL | Status: AC
  Administered 2024-02-28: 10 mg via ORAL
  Filled 2024-02-28: qty 2

## 2024-02-28 NOTE — Plan of Care (Signed)
  Problem: Activity: Goal: Risk for activity intolerance will decrease Outcome: Progressing   Problem: Pain Managment: Goal: General experience of comfort will improve and/or be controlled Outcome: Progressing

## 2024-02-28 NOTE — Anesthesia Preprocedure Evaluation (Signed)
 Anesthesia Evaluation  Patient identified by MRN, date of birth, ID band Patient awake    Reviewed: Allergy & Precautions, H&P , NPO status , Patient's Chart, lab work & pertinent test results  Airway Mallampati: II  TM Distance: >3 FB Neck ROM: Full    Dental no notable dental hx. (+) Teeth Intact, Dental Advisory Given   Pulmonary neg pulmonary ROS   Pulmonary exam normal breath sounds clear to auscultation       Cardiovascular hypertension, Pt. on medications  Rhythm:Regular Rate:Normal     Neuro/Psych negative neurological ROS  negative psych ROS   GI/Hepatic Neg liver ROS,GERD  Medicated,,Choledocholithiasis Pancreatitis   Endo/Other  negative endocrine ROS  Obesity HLD  Renal/GU negative Renal ROS  negative genitourinary   Musculoskeletal negative musculoskeletal ROS (+)    Abdominal   Peds  Hematology  (+) Blood dyscrasia, anemia Thrombocytopenia Lab Results      Component                Value               Date                      WBC                      5.9                 02/28/2024                HGB                      9.5 (L)             02/28/2024                HCT                      30.4 (L)            02/28/2024                MCV                      97.1                02/28/2024                PLT                      136 (L)             02/28/2024              Anesthesia Other Findings   Reproductive/Obstetrics negative OB ROS                              Anesthesia Physical Anesthesia Plan  ASA: 3  Anesthesia Plan: General   Post-op Pain Management: Minimal or no pain anticipated   Induction: Intravenous  PONV Risk Score and Plan: 4 or greater and Treatment may vary due to age or medical condition, Ondansetron and Dexamethasone  Airway Management Planned: Oral ETT  Additional Equipment: None  Intra-op Plan:   Post-operative Plan: Extubation  in OR  Informed Consent: I have reviewed the patients History and Physical, chart, labs and discussed the procedure including the risks, benefits  and alternatives for the proposed anesthesia with the patient or authorized representative who has indicated his/her understanding and acceptance.   Patient has DNR.  Discussed DNR with patient and Continue DNR.   Dental advisory given and Interpreter used for interview  Plan Discussed with: CRNA  Anesthesia Plan Comments:          Anesthesia Quick Evaluation

## 2024-02-28 NOTE — Progress Notes (Signed)
 PROGRESS NOTE    Robin Hayes  FMW:[id number] DOB: [birth year] DOA: 02/27/2024 PCP: Patient, No Pcp Per    Brief Narrative:  83 year old with history of hypertension, history of cholecystectomy presented with intermittent waxing and waning abdominal pain.  She has been following up with gastroenterology as outpatient, right upper quadrant ultrasound showed dilated CBD.  Underwent outpatient MRCP that showed choledocholithiasis, 0.7 cm calculus at the ampulla.  Sent to hospital for admission and ERCP.  Subjective: Patient seen and examined.  Her daughter was at the bedside.  Patient requested her daughter to translate for her.  Complains of moderate and occasional severe pain in the abdomen.  Looks comfortable otherwise.  Denies any nausea vomiting.  Discussed with GI, procedure postponed for tomorrow. Assessment & Plan:   Acute choledocholithiasis with obstructive jaundice: Continue supportive care.  Adequate pain medications.  Maintenance IV fluids.  Monitor LFTs.  Followed by GI, n.p.o. past midnight tomorrow for ERCP.  Hypertension: Blood pressure stable.  Antihypertensives on hold.  Hyperlipidemia: On Crestor.  On hold until LFTs improved.    DVT prophylaxis: SCDs Start: 02/27/24 2045   Code Status: DNR with intervention Family Communication: Daughter at bedside Disposition Plan: Status is: Inpatient Remains inpatient appropriate because: Inpatient procedure planned significant symptoms     Consultants:  Gastroenterology  Procedures:  None  Antimicrobials:  None     Objective: Vitals:   02/28/24 0523 02/28/24 0527 02/28/24 1003 02/28/24 1322  BP: (!) 110/46  (!) 102/47 (!) 117/47  Pulse: 67  65 70  Resp: 20  (!) 21 18  Temp: 99.3 F (37.4 C)  98 F (36.7 C) 98.1 F (36.7 C)  TempSrc:      SpO2: 95%  94% 97%  Weight:  68.8 kg    Height:  4' 10.5 (1.486 m)     No intake or output data in the 24 hours ending 02/28/24 1352 Filed Weights   02/28/24 0527   Weight: 68.8 kg    Examination:  General exam: Appears calm and comfortable on interview. Respiratory system: Clear to auscultation. Respiratory effort normal. Cardiovascular system: S1 & S2 heard, RRR. No JVD, murmurs, rubs, gallops or clicks. No pedal edema. Gastrointestinal system: Abdomen is nondistended, soft.  Mild tenderness right upper quadrant on exam.  No rigidity.  No guarding.  Bowel sounds present. Central nervous system: Alert and oriented. No focal neurological deficits. Extremities: Symmetric 5 x 5 power. Skin: No rashes, lesions or ulcers Psychiatry: Judgement and insight appear normal. Mood & affect appropriate.     Data Reviewed: I have personally reviewed following labs and imaging studies  CBC: Recent Labs  Lab 02/23/24 1201 02/27/24 1715 02/28/24 0335  WBC 7.4 9.3 5.9  NEUTROABS 5.0  --   --   HGB 13.0 13.3 9.5*  HCT 38.8 41.6 30.4*  MCV 93.7 96.7 97.1  PLT 235.0 250 136*   Basic Metabolic Panel: Recent Labs  Lab 02/23/24 1201 02/27/24 1715 02/28/24 0335  NA 136 136 135  K 3.6 5.2* 4.4  CL 102 103 107  CO2 25 23 22   GLUCOSE 120* 100* 90  BUN 14 15 15   CREATININE 0.63 0.76 0.63  CALCIUM 8.3* 8.8* 7.6*   GFR: Estimated Creatinine Clearance: 46 mL/min (by C-G formula based on SCr of 0.63 mg/dL). Liver Function Tests: Recent Labs  Lab 02/23/24 1201 02/27/24 1715 02/28/24 0335  AST 195* 262* 161*  ALT 76* 96* 61*  ALKPHOS 893* 1,015* 687*  BILITOT 4.8* 4.9* 2.6*  PROT 7.2  7.5 5.3*  ALBUMIN 2.9* 3.0* 2.1*   Recent Labs  Lab 02/23/24 1201 02/27/24 1715  LIPASE 7.0* 10*   No results for input(s): AMMONIA in the last 168 hours. Coagulation Profile: Recent Labs  Lab 02/23/24 1201 02/28/24 0335  INR 1.3* 1.3*   Cardiac Enzymes: No results for input(s): CKTOTAL, CKMB, CKMBINDEX, TROPONINI in the last 168 hours. BNP (last 3 results) No results for input(s): PROBNP in the last 8760 hours. HbA1C: No results for  input(s): HGBA1C in the last 72 hours. CBG: No results for input(s): GLUCAP in the last 168 hours. Lipid Profile: No results for input(s): CHOL, HDL, LDLCALC, TRIG, CHOLHDL, LDLDIRECT in the last 72 hours. Thyroid Function Tests: No results for input(s): TSH, T4TOTAL, FREET4, T3FREE, THYROIDAB in the last 72 hours. Anemia Panel: No results for input(s): VITAMINB12, FOLATE, FERRITIN, TIBC, IRON, RETICCTPCT in the last 72 hours. Sepsis Labs: No results for input(s): PROCALCITON, LATICACIDVEN in the last 168 hours.  No results found for this or any previous visit (from the past 240 hours).       Radiology Studies: No results found.      Scheduled Meds: Continuous Infusions:   LOS: 1 day    Time spent: 40 minutes    Renato Applebaum, MD [facility]

## 2024-02-29 ENCOUNTER — Encounter (HOSPITAL_COMMUNITY)
Admission: EM | Disposition: A | Discharge status: Home / Self Care | Payer: Self-pay | Source: Home / Self Care | Attending: Internal Medicine

## 2024-02-29 ENCOUNTER — Encounter (HOSPITAL_COMMUNITY): Payer: Self-pay | Admitting: Anesthesiology

## 2024-02-29 ENCOUNTER — Inpatient Hospital Stay (HOSPITAL_COMMUNITY): Payer: Self-pay | Admitting: Anesthesiology

## 2024-02-29 ENCOUNTER — Encounter (HOSPITAL_COMMUNITY): Payer: Self-pay | Admitting: Internal Medicine

## 2024-02-29 ENCOUNTER — Inpatient Hospital Stay (HOSPITAL_COMMUNITY)

## 2024-02-29 DIAGNOSIS — K8050 Calculus of bile duct without cholangitis or cholecystitis without obstruction: Secondary | ICD-10-CM

## 2024-02-29 DIAGNOSIS — D696 Thrombocytopenia, unspecified: Secondary | ICD-10-CM

## 2024-02-29 DIAGNOSIS — K2970 Gastritis, unspecified, without bleeding: Secondary | ICD-10-CM

## 2024-02-29 DIAGNOSIS — K31A19 Gastric intestinal metaplasia without dysplasia, unspecified site: Secondary | ICD-10-CM

## 2024-02-29 DIAGNOSIS — I8500 Esophageal varices without bleeding: Secondary | ICD-10-CM

## 2024-02-29 DIAGNOSIS — K838 Other specified diseases of biliary tract: Secondary | ICD-10-CM

## 2024-02-29 DIAGNOSIS — K2289 Other specified disease of esophagus: Secondary | ICD-10-CM

## 2024-02-29 DIAGNOSIS — R161 Splenomegaly, not elsewhere classified: Secondary | ICD-10-CM

## 2024-02-29 DIAGNOSIS — K3189 Other diseases of stomach and duodenum: Secondary | ICD-10-CM

## 2024-02-29 DIAGNOSIS — K831 Obstruction of bile duct: Secondary | ICD-10-CM

## 2024-02-29 DIAGNOSIS — K7469 Other cirrhosis of liver: Secondary | ICD-10-CM

## 2024-02-29 DIAGNOSIS — K2950 Unspecified chronic gastritis without bleeding: Secondary | ICD-10-CM

## 2024-02-29 DIAGNOSIS — I10 Essential (primary) hypertension: Secondary | ICD-10-CM | POA: Diagnosis not present

## 2024-02-29 DIAGNOSIS — K766 Portal hypertension: Secondary | ICD-10-CM

## 2024-02-29 HISTORY — PX: ERCP: SHX5425

## 2024-02-29 SURGERY — ERCP, WITH INTERVENTION IF INDICATED
Anesthesia: General

## 2024-02-29 MED ORDER — SODIUM CHLORIDE 0.9 % IV SOLN
INTRAVENOUS | Status: DC | PRN
  Administered 2024-02-29: 40 mL

## 2024-02-29 MED ORDER — AMISULPRIDE (ANTIEMETIC) 5 MG/2ML IV SOLN
INTRAVENOUS | Status: AC
  Filled 2024-02-29: qty 2

## 2024-02-29 MED ORDER — DEXAMETHASONE SOD PHOSPHATE PF 10 MG/ML IJ SOLN
INTRAMUSCULAR | Status: DC | PRN
  Administered 2024-02-29: 10 mg via INTRAVENOUS

## 2024-02-29 MED ORDER — PROPOFOL 10 MG/ML IV BOLUS
INTRAVENOUS | Status: DC | PRN
  Administered 2024-02-29: 80 mg via INTRAVENOUS

## 2024-02-29 MED ORDER — AMISULPRIDE (ANTIEMETIC) 5 MG/2ML IV SOLN
5.0000 mg | Freq: Once | INTRAVENOUS | Status: AC
  Administered 2024-02-29: 5 mg via INTRAVENOUS

## 2024-02-29 MED ORDER — SODIUM CHLORIDE 0.9 % IV SOLN: INTRAVENOUS | Status: AC

## 2024-02-29 MED ORDER — PANTOPRAZOLE SODIUM 40 MG PO TBEC
40.0000 mg | DELAYED_RELEASE_TABLET | Freq: Every day | ORAL | Status: DC
  Administered 2024-02-29 – 2024-03-01 (×2): 40 mg via ORAL
  Filled 2024-02-29 (×2): qty 1

## 2024-02-29 MED ORDER — CIPROFLOXACIN IN D5W 400 MG/200ML IV SOLN
INTRAVENOUS | Status: DC | PRN
  Administered 2024-02-29: 400 mg via INTRAVENOUS

## 2024-02-29 MED ORDER — GLUCAGON HCL RDNA (DIAGNOSTIC) 1 MG IJ SOLR
INTRAMUSCULAR | Status: DC | PRN
  Administered 2024-02-29 (×2): .25 mg via INTRAVENOUS

## 2024-02-29 MED ORDER — SODIUM CHLORIDE 0.9 % IV SOLN: INTRAVENOUS | Status: DC

## 2024-02-29 MED ORDER — SODIUM CHLORIDE 0.9 % IV SOLN: INTRAVENOUS | Status: DC | PRN

## 2024-02-29 MED ORDER — ROCURONIUM BROMIDE 10 MG/ML (PF) SYRINGE
PREFILLED_SYRINGE | INTRAVENOUS | Status: DC | PRN
  Administered 2024-02-29: 20 mg via INTRAVENOUS
  Administered 2024-02-29: 40 mg via INTRAVENOUS

## 2024-02-29 MED ORDER — ONDANSETRON HCL 4 MG/2ML IJ SOLN
INTRAMUSCULAR | Status: DC | PRN
  Administered 2024-02-29: 4 mg via INTRAVENOUS

## 2024-02-29 MED ORDER — PHENYLEPHRINE 80 MCG/ML (10ML) SYRINGE FOR IV PUSH (FOR BLOOD PRESSURE SUPPORT)
PREFILLED_SYRINGE | INTRAVENOUS | Status: DC | PRN
  Administered 2024-02-29 (×3): 160 ug via INTRAVENOUS
  Administered 2024-02-29: 240 ug via INTRAVENOUS
  Administered 2024-02-29: 160 ug via INTRAVENOUS
  Administered 2024-02-29: 240 ug via INTRAVENOUS
  Administered 2024-02-29 (×3): 160 ug via INTRAVENOUS

## 2024-02-29 MED ORDER — DICLOFENAC SUPPOSITORY 100 MG
RECTAL | Status: DC | PRN
  Administered 2024-02-29: 100 mg via RECTAL

## 2024-02-29 MED ORDER — SUGAMMADEX SODIUM 200 MG/2ML IV SOLN
INTRAVENOUS | Status: DC | PRN
  Administered 2024-02-29: 150 mg via INTRAVENOUS

## 2024-02-29 MED ORDER — LIDOCAINE 2% (20 MG/ML) 5 ML SYRINGE
INTRAMUSCULAR | Status: DC | PRN
  Administered 2024-02-29: 60 mg via INTRAVENOUS

## 2024-02-29 NOTE — Op Note (Signed)
 [facility] Patient Name: Robin Hayes Procedure Date: 02/29/2024 MRN: [id number] Attending MD: Aloha Finner , MD, [id number] Date of Birth: November 27, 1940 CSN: [id number] Age: 83 Admit Type: Inpatient Procedure:                ERCP Indications:              Bile duct stone(s), Jaundice, Elevated liver                            enzymes, Cirrhosis rule out esophageal varices Providers:                Aloha Finner, MD, Collene Edu, RN, Felice Sar,                            Technician Referring MD:              Medicines:                General Anesthesia, Cipro 400 mg IV, Diclofenac 100                            mg rectal, Glucagon 0.5 mg IV Complications:            No immediate complications. Estimated Blood Loss:     Estimated blood loss was minimal. Procedure:                Pre-Anesthesia Assessment:                           - Prior to the procedure, a History and Physical                            was performed, and patient medications and                            allergies were reviewed. The patient's tolerance of                            previous anesthesia was also reviewed. The risks                            and benefits of the procedure and the sedation                            options and risks were discussed with the patient.                            All questions were answered, and informed consent                            was obtained. Prior Anticoagulants: The patient has                            taken no anticoagulant or antiplatelet agents. ASA  Grade Assessment: III - A patient with severe                            systemic disease. After reviewing the risks and                            benefits, the patient was deemed in satisfactory                            condition to undergo the procedure.                           After obtaining informed consent, the scope was                             passed under direct vision. Throughout the                            procedure, the patient's blood pressure, pulse, and                            oxygen saturations were monitored continuously. The                            [id number]-H190 ([id number]) Olympus endoscope was introduced                            through the mouth, and used to inject contrast into                            and used to locate the major papilla. The [id number]-[id number]                            ([id number]) Olympus duodenoscope was introduced                            through the mouth, and used to inject contrast into                            and used to inject contrast into the bile duct and                            ventral pancreatic duct. The ERCP was technically                            difficult and complex due to challenging                            cannulation. Successful completion of the procedure                            was aided by performing the maneuvers documented                            (  below) in this report. The patient tolerated the                            procedure. Scope In: Scope Out: Findings:      The scout film was normal.      A standard esophagogastroduodenoscopy scope was used for the examination       of the upper gastrointestinal tract. The scope was passed under direct       vision through the upper GI tract. No gross lesions were noted in the       proximal esophagus. Grade I, grade II, grade III varices were found in       the mid esophagus and in the distal esophagus. The Z-line was irregular       and was found 38 cm from the incisors. Mild portal hypertensive       gastropathy was found in the entire examined stomach. Patchy mildly       erythematous mucosa without bleeding was found in the entire examined       stomach - biopsied for HP evaluation. There is no endoscopic evidence of       gastric varices in the entire examined stomach. No gross lesions were       noted  in the duodenal bulb, in the first portion of the duodenum and in       the second portion of the duodenum. The major papilla was congested.      The bile duct could not be cannulated with the Hydratome and revolution       sphincterotomes. Trying different positions including long position did       not allow adequate visualization enfos. Decision made to proceed with       attempt at biliary precut fistulotomy. A biliary pre-cut fistulotomy       measuring 13 mm in length was made with a monofilament needle knife       using a freehand technique using ERBE electrocautery. There was no       post-sphincterotomy bleeding. Repeated attempts at biliary cannulation       were not successful while using a wire-guided approach. This eventually       however led to placement of the wire within the pancreatic duct. The       wire was left within the pancreatic duct. One 4 Fr by 5 cm temporary       plastic pancreatic stent with a single external pigtail was placed into       the ventral pancreatic duct. The stent was in good position.      Subsequently, a 0.035 inch x 260 cm straight Hydra Jagwire was passed       into the biliary tree. The short-nosed traction sphincterotome was       passed over the guidewire and the bile duct was then deeply cannulated.       Contrast was injected. I personally interpreted the bile duct images.       Ductal flow of contrast was adequate. Image quality was adequate.       Contrast extended to the hepatic ducts. Opacification of the entire       biliary tree except for the cystic duct and gallbladder was successful.       The entire biliary tree was severely dilated. The largest diameter was       18 mm. The middle third of the  main bile duct contained filling       defect(s) thought to be a stone and sludge. The biliary sphincterotomy       was extended another 5 mm in length with a monofilament traction       (standard) sphincterotome using ERBE electrocautery.  There was no       post-sphincterotomy bleeding. To discover objects, the biliary tree was       swept with a retrieval balloon. Sludge was swept from the duct. One       stone was removed. No stones remained. An occlusion cholangiogram was       performed that showed no further significant biliary pathology.      A formal pancreatogram was not performed.      The duodenoscope was withdrawn from the patient. Impression:               - No gross lesions in the proximal esophagus. 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                           - Z-line irregular, 38 cm from the incisors.                           - Portal hypertensive gastropathy.                           - Erythematous mucosa in the stomach. Biopsied.                           - No gross lesions in the duodenal bulb, in the                            first portion of the duodenum and in the second                            portion of the duodenum.                           - The major papilla appeared congested.                           - Difficult cannulation eventually requiring                            biliary fistulotomy precut to access the biliary                            tree as documented above.                           - One temporary plastic pancreatic stent was placed  into the ventral pancreatic duct.                           - After biliary cannulation was obtained, a filling                            defect consistent with a stone and sludge was seen                            on the cholangiogram.                           - The entire biliary tree was severely dilated.                           - Choledocholithiasis was found. Complete removal                            was accomplished by biliary                            sphincterotomy/fistulotomy and balloon  sweeping. Moderate Sedation:      Not Applicable - Patient had care per Anesthesia. Recommendation:           - The patient will be observed post-procedure,                            until all discharge criteria are met.                           - Return patient to hospital ward for ongoing care.                           - Advance diet as tolerated.                           - Observe patient's clinical course.                           - Await path results.                           - Depending on how patient is doing into tomorrow,                            I strongly recommend initiation of carvedilol 6.25                            mg once to twice daily. This will help decrease her                            risk of bleeding from varices in the future. She                            does have grade 2  and grade 3 varices, so banding                            protocol would be considered in future. I did not                            want to approach that without her being on adequate                            beta-blockade, this being a new diagnosis of                            cirrhosis/portal hypertension, and with us  having                            just done a large fistulotomy/sphincterotomy where                            if she had bleeding having bands in place could                            make things more difficult if bleeding occurred.                           - Watch for pancreatitis, bleeding, perforation,                            and cholangitis.                           - Patient will need a KUB 2-view in 10-14 days to                            ensure pancreatic stent has migrated successfully.                            If still present at that time will need to be                            scheduled for EGD with stent pull.                           - The findings and recommendations were discussed                            with the patient.                            - The findings and recommendations were discussed                            with the patient's family.                           -  The findings and recommendations were discussed                            with the referring physician. Procedure Code(s):        --- Professional ---                           [phone], Endoscopic retrograde                            cholangiopancreatography (ERCP); with placement of                            endoscopic stent into biliary or pancreatic duct,                            including pre- and post-dilation and guide wire                            passage, when performed, including sphincterotomy,                            when performed, each stent                           43264, Endoscopic retrograde                            cholangiopancreatography (ERCP); with removal of                            calculi/debris from biliary/pancreatic duct(s)                           43262, 59, Endoscopic retrograde                            cholangiopancreatography (ERCP); with                            sphincterotomy/papillotomy                           74328, 26, Endoscopic catheterization of the                            biliary ductal system, radiological supervision and                            interpretation Diagnosis Code(s):        --- Professional ---                           K74.60, Unspecified cirrhosis of liver                           I85.10, Secondary esophageal varices without  bleeding                           K22.89, Other specified disease of esophagus                           K76.6, Portal hypertension                           K31.89, Other diseases of stomach and duodenum                           K83.8, Other specified diseases of biliary tract                           K80.50, Calculus of bile duct without cholangitis                            or cholecystitis without  obstruction                           R17, Unspecified jaundice                           R74.8, Abnormal levels of other serum enzymes                           R93.2, Abnormal findings on diagnostic imaging of                            liver and biliary tract CPT copyright 2022 [employer]. All rights reserved. The codes documented in this report are preliminary and upon coder review may  be revised to meet current compliance requirements. Aloha Finner, MD 02/29/2024 9:35:07 AM Number of Addenda: 0

## 2024-02-29 NOTE — Plan of Care (Signed)
   Problem: Activity: Goal: Risk for activity intolerance will decrease Outcome: Progressing

## 2024-02-29 NOTE — Progress Notes (Signed)
 Patient's son at the bedside and refused morning labs. Education provided.

## 2024-02-29 NOTE — Interval H&P Note (Signed)
 History and Physical Interval Note:  02/29/2024 7:30 AM  Robin Hayes  has presented today for surgery, with the diagnosis of Stone in bile duct.  The various methods of treatment have been discussed with the patient and family. After consideration of risks, benefits and other options for treatment, the patient has consented to  Procedure(s): ERCP, WITH INTERVENTION IF INDICATED (N/A) as a surgical intervention.  The patient's history has been reviewed, patient examined, no change in status, stable for surgery.  I have reviewed the patient's chart and labs.  Questions were answered to the patient's satisfaction.     Tate Zagal Mansouraty Jr

## 2024-02-29 NOTE — Anesthesia Postprocedure Evaluation (Signed)
 Anesthesia Post Note  Patient: Mercedies Ganesh  Procedure(s) Performed: ERCP, WITH INTERVENTION IF INDICATED     Patient location during evaluation: PACU Anesthesia Type: General Level of consciousness: awake and alert Pain management: pain level controlled Vital Signs Assessment: post-procedure vital signs reviewed and stable Respiratory status: spontaneous breathing, nonlabored ventilation, respiratory function stable and patient connected to nasal cannula oxygen Cardiovascular status: blood pressure returned to baseline and stable Postop Assessment: no apparent nausea or vomiting Anesthetic complications: no   No notable events documented.  Last Vitals:  Vitals:   02/29/24 0945 02/29/24 1000  BP: (!) 123/59 127/62  Pulse: 78 77  Resp: 17 (!) 21  Temp:  36.4 C  SpO2: 94% 94%    Last Pain:  Vitals:   02/29/24 1000  TempSrc:   PainSc: 0-No pain                 Jimmie Rueter,W. EDMOND

## 2024-02-29 NOTE — Plan of Care (Signed)

## 2024-02-29 NOTE — Progress Notes (Signed)
 PROGRESS NOTE    Robin Hayes  FMW:[id number] DOB: [birth year] DOA: 02/27/2024 PCP: Patient, No Pcp Per    Brief Narrative:  83 year old with history of hypertension, history of cholecystectomy presented with intermittent waxing and waning abdominal pain.  She has been following up with gastroenterology as outpatient, right upper quadrant ultrasound showed dilated CBD.  Underwent outpatient MRCP that showed choledocholithiasis, 0.7 cm calculus at the ampulla.  Sent to hospital for admission and ERCP.  Subjective: Patient seen and examined.  Came back from procedure.  Discussed with gastroenterology.  Patient underwent sphincterotomy, plastic stent placement and stone removal from bile duct. Complains of mild abdominal distention but denies any pain. Son and granddaughter at the bedside, helping with translation.  Patient is eager to go home.   Assessment & Plan:   Acute choledocholithiasis with obstructive jaundice: Continue supportive care.  Continue maintenance IV fluids.  Recheck LFTs tomorrow. Underwent ERCP, found to have portal hypertensive gastropathy, sphincterotomy and stone extraction. Monitor tonight.  Recheck levels tomorrow morning.  Hypertension: Blood pressure stable.  Antihypertensives on hold.  On lisinopril at home.  GI recommended to start beta-blockers given portal gastropathy.  Hyperlipidemia: On Crestor.  On hold until LFTs improved.    DVT prophylaxis: SCDs Start: 02/27/24 2045   Code Status: DNR with intervention Family Communication: Son at the bedside.  Granddaughter at the bedside. Disposition Plan: Status is: Inpatient Remains inpatient appropriate because: Immediate postoperative.     Consultants:  Gastroenterology  Procedures:  ERCP, biliary stenting  Antimicrobials:  None     Objective: Vitals:   02/29/24 0507 02/29/24 0938 02/29/24 0945 02/29/24 1000  BP: (!) 103/48 129/60 (!) 123/59 127/62  Pulse: 71 83 78 77  Resp: 18 14 17   (!) 21  Temp: 98.1 F (36.7 C) 97.8 F (36.6 C)  97.6 F (36.4 C)  TempSrc:      SpO2: 99% (!) 89% 94% 94%  Weight:      Height:        Intake/Output Summary (Last 24 hours) at 02/29/2024 1313 Last data filed at 02/29/2024 0934 Gross per 24 hour  Intake 1648.1 ml  Output --  Net 1648.1 ml   Filed Weights   02/28/24 0527  Weight: 68.8 kg    Examination:  General: Looks fairly comfortable.  Pleasant interactive. Cardiovascular: S1-S2 normal.  Regular rate rhythm. Respiratory: Bilateral clear.  No added sounds. Gastrointestinal: Soft.  Nontender.  Bowel sound present. Ext: No swelling or edema.  No cyanosis. Neuro: Alert awake and oriented.      Data Reviewed: I have personally reviewed following labs and imaging studies  CBC: Recent Labs  Lab 02/23/24 1201 02/27/24 1715 02/28/24 0335  WBC 7.4 9.3 5.9  NEUTROABS 5.0  --   --   HGB 13.0 13.3 9.5*  HCT 38.8 41.6 30.4*  MCV 93.7 96.7 97.1  PLT 235.0 250 136*   Basic Metabolic Panel: Recent Labs  Lab 02/23/24 1201 02/27/24 1715 02/28/24 0335  NA 136 136 135  K 3.6 5.2* 4.4  CL 102 103 107  CO2 25 23 22   GLUCOSE 120* 100* 90  BUN 14 15 15   CREATININE 0.63 0.76 0.63  CALCIUM 8.3* 8.8* 7.6*   GFR: Estimated Creatinine Clearance: 46 mL/min (by C-G formula based on SCr of 0.63 mg/dL). Liver Function Tests: Recent Labs  Lab 02/23/24 1201 02/27/24 1715 02/28/24 0335  AST 195* 262* 161*  ALT 76* 96* 61*  ALKPHOS 893* 1,015* 687*  BILITOT 4.8* 4.9* 2.6*  PROT  7.2 7.5 5.3*  ALBUMIN 2.9* 3.0* 2.1*   Recent Labs  Lab 02/23/24 1201 02/27/24 1715  LIPASE 7.0* 10*   No results for input(s): AMMONIA in the last 168 hours. Coagulation Profile: Recent Labs  Lab 02/23/24 1201 02/28/24 0335  INR 1.3* 1.3*   Cardiac Enzymes: No results for input(s): CKTOTAL, CKMB, CKMBINDEX, TROPONINI in the last 168 hours. BNP (last 3 results) No results for input(s): PROBNP in the last 8760  hours. HbA1C: No results for input(s): HGBA1C in the last 72 hours. CBG: No results for input(s): GLUCAP in the last 168 hours. Lipid Profile: No results for input(s): CHOL, HDL, LDLCALC, TRIG, CHOLHDL, LDLDIRECT in the last 72 hours. Thyroid Function Tests: No results for input(s): TSH, T4TOTAL, FREET4, T3FREE, THYROIDAB in the last 72 hours. Anemia Panel: No results for input(s): VITAMINB12, FOLATE, FERRITIN, TIBC, IRON, RETICCTPCT in the last 72 hours. Sepsis Labs: No results for input(s): PROCALCITON, LATICACIDVEN in the last 168 hours.  No results found for this or any previous visit (from the past 240 hours).       Radiology Studies: DG C-Arm 1-60 Min-No Report Result Date: 02/29/2024 Fluoroscopy was utilized by the requesting physician.  No radiographic interpretation.        Scheduled Meds:  famotidine  20 mg Oral BID   pantoprazole  40 mg Oral Daily   Continuous Infusions:  sodium chloride 100 mL/hr at 02/29/24 1242     LOS: 2 days    Time spent: 40 minutes    Renato Applebaum, MD [facility]

## 2024-02-29 NOTE — Transfer of Care (Signed)
 Immediate Anesthesia Transfer of Care Note  Patient: Robin Hayes  Procedure(s) Performed: ERCP, WITH INTERVENTION IF INDICATED  Patient Location: PACU  Anesthesia Type:General  Level of Consciousness: drowsy and patient cooperative  Airway & Oxygen Therapy: Patient Spontanous Breathing and Patient connected to nasal cannula oxygen  Post-op Assessment: Report given to RN and Post -op Vital signs reviewed and stable  Post vital signs: Reviewed and stable  Last Vitals:  Vitals Value Taken Time  BP 129/60 02/29/24 09:38  Temp 36.6 C 02/29/24 09:38  Pulse 79 02/29/24 09:44  Resp 18 02/29/24 09:44  SpO2 94 % 02/29/24 09:44  Vitals shown include unfiled device data.  Last Pain:  Vitals:   02/29/24 0938  TempSrc:   PainSc: 0-No pain         Complications: No notable events documented.

## 2024-02-29 NOTE — Anesthesia Procedure Notes (Signed)
 Procedure Name: Intubation Date/Time: 02/29/2024 7:48 AM  Performed by: Claudene Hoy CROME, CRNAPre-anesthesia Checklist: Patient identified, Emergency Drugs available, Suction available and Patient being monitored Patient Re-evaluated:Patient Re-evaluated prior to induction Oxygen Delivery Method: Circle System Utilized Preoxygenation: Pre-oxygenation with 100% oxygen Induction Type: IV induction Ventilation: Mask ventilation without difficulty Laryngoscope Size: Mac and 3 Grade View: Grade I Tube type: Oral Tube size: 7.0 mm Number of attempts: 1 Airway Equipment and Method: Stylet Placement Confirmation: ETT inserted through vocal cords under direct vision, positive ETCO2 and breath sounds checked- equal and bilateral Secured at: 19 cm Tube secured with: Tape Dental Injury: Teeth and Oropharynx as per pre-operative assessment

## 2024-02-29 NOTE — Progress Notes (Signed)
 Gastroenterology Inpatient Follow-up Note   PATIENT IDENTIFICATION  Robin Hayes is a 83 y.o. female Hospital Day: 3  SUBJECTIVE  The patient's chart has been reviewed. The patient's labs have been reviewed.  LFT pattern has decreased.  She has now developed a progressive thrombocytopenia but it looks like all her cell counts are down so this is likely delusional and affect somewhat. Today, the patient is feeling well. Her daughter is at her bedside and acts as her interpreter per her request. The patient denies fevers or chills. She denies progressive jaundice and thinks that her urine is getting slightly less dark. She is planned for possible EGD/ERCP later today procedure done.   OBJECTIVE   Scheduled Inpatient Medications:   famotidine  20 mg Oral BID   Continuous Inpatient Infusions:   sodium chloride 20 mL/hr at 02/29/24 0521   PRN Inpatient Medications: acetaminophen **OR** acetaminophen, guaiFENesin-dextromethorphan, melatonin, ondansetron **OR** ondansetron (ZOFRAN) IV, polyethylene glycol   Physical Examination   Temp:  [98 F (36.7 C)-98.3 F (36.8 C)] 98.1 F (36.7 C) (10/26 0507) Pulse Rate:  [65-71] 71 (10/26 0507) Resp:  [18-21] 18 (10/26 0507) BP: (102-118)/(47-52) 103/48 (10/26 0507) SpO2:  [94 %-99 %] 99 % (10/26 0507) Temp (24hrs), Avg:98.1 F (36.7 C), Min:98 F (36.7 C), Max:98.3 F (36.8 C)  Weight: 68.8 kg GEN: NAD, appears stated age, doesn't appear chronically ill, daughter at bedside PSYCH: Cooperative, without pressured speech EYE: Icteric sclerae ENT: MMM CV: Nontachycardic RESP: No audible wheezing GI: NABS, soft, NT/ND, without rebound or guarding MSK/EXT: No significant lower extremity edema SKIN: Jaundiced NEURO:  Alert & Oriented x 3, no focal deficits, no evidence of asterixis   Review of Data   Laboratory Studies   Recent Labs  Lab 02/28/24 0335  NA 135  K 4.4  CL 107  CO2 22  BUN 15  CREATININE 0.63  GLUCOSE  90  CALCIUM 7.6*   Recent Labs  Lab 02/28/24 0335  AST 161*  ALT 61*  ALKPHOS 687*    Recent Labs  Lab 02/23/24 1201 02/27/24 1715 02/28/24 0335  WBC 7.4 9.3 5.9  HGB 13.0 13.3 9.5*  HCT 38.8 41.6 30.4*  PLT 235.0 250 136*   Recent Labs  Lab 02/23/24 1201 02/28/24 0335  INR 1.3* 1.3*   MELD 3.0: 18 at 02/28/2024  3:35 AM MELD-Na: 15 at 02/28/2024  3:35 AM Calculated from: Serum Creatinine: 0.63 mg/dL (Using min of 1 mg/dL) at 89/74/7974  6:64 AM Serum Sodium: 135 mmol/L at 02/28/2024  3:35 AM Total Bilirubin: 2.6 mg/dL at 89/74/7974  6:64 AM Serum Albumin: 2.1 g/dL at 89/74/7974  6:64 AM INR(ratio): 1.3 at 02/28/2024  3:35 AM Age at listing (hypothetical): 83 years Sex: Female at 02/28/2024  3:35 AM  Imaging Studies  No results found.  GI Procedures and Studies  No relevant studies to review   ASSESSMENT  [name] is a 83 y.o. female with a pmh significant for hypertension, hyperlipidemia, obesity, status post cholecystectomy who was admitted after MRI/MRCP showed evidence of choledocholithiasis with abnormal LFTs and obstructive jaundice to have splenomegaly and liver nodularity.  Patient is chemically and clinically stable at this time. I was truthful with the patient and family that I do not know if all of her liver biochemical testing abnormalities are a result of just choledocholithiasis and biliary obstruction.  With her imaging suggestive of splenomegaly as well as some nodularity to her liver, underlying cirrhosis could also be playing some role in regards to  her potential workup etiology of symptoms.  She is at risk of metabolic associated steatotic liver disease or in the setting of her elevated ANA and immunoglobulins, the possibility of an underlying autoimmune hepatitis should still be kept in mind, though anti-smooth muscle antibody was normal.  We discussed the potential need of a liver biopsy at some point in time should liver biochemical testing not  prove.  Will plan to do full endoscopy and then ERCP for attempted stone extraction.  Our goal and hope is to do this later today pending or availability from an anesthesia standpoint otherwise procedure will be done on Sunday.  The risks of an ERCP were discussed at length, including but not limited to the risk of perforation, bleeding, abdominal pain, post-ERCP pancreatitis (while usually mild can be severe and even life threatening).  The risks and benefits of endoscopic evaluation were discussed with the patient; these include but are not limited to the risk of perforation, infection, bleeding, missed lesions, lack of diagnosis, severe illness requiring hospitalization, as well as anesthesia and sedation related illnesses.  The patient and/or family is agreeable to proceed.   All patient questions were answered to the best of my ability, and the patient agrees to the aforementioned plan of action with follow-up as indicated.   PLAN/RECOMMENDATIONS  Please maintain n.p.o. status 10 mg p.o. vitamin K for INR 1.3 Plan for EGD/ERCP later today pending availability in endoscopy unit Consideration of liver biopsy at some point in future pending liver biochemical testing post management choledocholithiasis   Please page/call with questions or concerns.   Aloha Finner, MD [facility] Office # [phone]   1:30 PM addendum This patient's procedure will have to be postponed until Sunday, she will be first case on Sunday morning on 10/26. Patient will be able to eat rest today and be n.p.o. at midnight

## 2024-02-29 NOTE — Interval H&P Note (Signed)
 History and Physical Interval Note:  02/29/2024 5:57 AM  Robin Hayes  has presented today for surgery, with the diagnosis of Stone in bile duct.  The various methods of treatment have been discussed with the patient and family. After consideration of risks, benefits and other options for treatment, the patient has consented to  Procedure(s): ERCP, WITH INTERVENTION IF INDICATED (N/A) as a surgical intervention.  The patient's history has been reviewed, patient examined, no change in status, stable for surgery.  I have reviewed the patient's chart and labs.  Questions were answered to the patient's satisfaction.     The risks of an ERCP were discussed at length, including but not limited to the risk of perforation, bleeding, abdominal pain, post-ERCP pancreatitis (while usually mild can be severe and even life threatening).    [name]

## 2024-02-29 NOTE — H&P (View-Only) (Signed)
 Gastroenterology Inpatient Follow-up Note   PATIENT IDENTIFICATION  Robin Hayes is a 83 y.o. female Hospital Day: 3  SUBJECTIVE  The patient's chart has been reviewed. The patient's labs have been reviewed.  LFT pattern has decreased.  She has now developed a progressive thrombocytopenia but it looks like all her cell counts are down so this is likely delusional and affect somewhat. Today, the patient is feeling well. Her daughter is at her bedside and acts as her interpreter per her request. The patient denies fevers or chills. She denies progressive jaundice and thinks that her urine is getting slightly less dark. She is planned for possible EGD/ERCP later today procedure done.   OBJECTIVE   Scheduled Inpatient Medications:   famotidine  20 mg Oral BID   Continuous Inpatient Infusions:   sodium chloride 20 mL/hr at 02/29/24 0521   PRN Inpatient Medications: acetaminophen **OR** acetaminophen, guaiFENesin-dextromethorphan, melatonin, ondansetron **OR** ondansetron (ZOFRAN) IV, polyethylene glycol   Physical Examination   Temp:  [98 F (36.7 C)-98.3 F (36.8 C)] 98.1 F (36.7 C) (10/26 0507) Pulse Rate:  [65-71] 71 (10/26 0507) Resp:  [18-21] 18 (10/26 0507) BP: (102-118)/(47-52) 103/48 (10/26 0507) SpO2:  [94 %-99 %] 99 % (10/26 0507) Temp (24hrs), Avg:98.1 F (36.7 C), Min:98 F (36.7 C), Max:98.3 F (36.8 C)  Weight: 68.8 kg GEN: NAD, appears stated age, doesn't appear chronically ill, daughter at bedside PSYCH: Cooperative, without pressured speech EYE: Icteric sclerae ENT: MMM CV: Nontachycardic RESP: No audible wheezing GI: NABS, soft, NT/ND, without rebound or guarding MSK/EXT: No significant lower extremity edema SKIN: Jaundiced NEURO:  Alert & Oriented x 3, no focal deficits, no evidence of asterixis   Review of Data   Laboratory Studies   Recent Labs  Lab 02/28/24 0335  NA 135  K 4.4  CL 107  CO2 22  BUN 15  CREATININE 0.63  GLUCOSE  90  CALCIUM 7.6*   Recent Labs  Lab 02/28/24 0335  AST 161*  ALT 61*  ALKPHOS 687*    Recent Labs  Lab 02/23/24 1201 02/27/24 1715 02/28/24 0335  WBC 7.4 9.3 5.9  HGB 13.0 13.3 9.5*  HCT 38.8 41.6 30.4*  PLT 235.0 250 136*   Recent Labs  Lab 02/23/24 1201 02/28/24 0335  INR 1.3* 1.3*   MELD 3.0: 18 at 02/28/2024  3:35 AM MELD-Na: 15 at 02/28/2024  3:35 AM Calculated from: Serum Creatinine: 0.63 mg/dL (Using min of 1 mg/dL) at 89/74/7974  6:64 AM Serum Sodium: 135 mmol/L at 02/28/2024  3:35 AM Total Bilirubin: 2.6 mg/dL at 89/74/7974  6:64 AM Serum Albumin: 2.1 g/dL at 89/74/7974  6:64 AM INR(ratio): 1.3 at 02/28/2024  3:35 AM Age at listing (hypothetical): 83 years Sex: Female at 02/28/2024  3:35 AM  Imaging Studies  No results found.  GI Procedures and Studies  No relevant studies to review   ASSESSMENT  Robin Hayes is a 83 y.o. female with a pmh significant for hypertension, hyperlipidemia, obesity, status post cholecystectomy who was admitted after MRI/MRCP showed evidence of choledocholithiasis with abnormal LFTs and obstructive jaundice to have splenomegaly and liver nodularity.  Patient is chemically and clinically stable at this time. I was truthful with the patient and family that I do not know if all of her liver biochemical testing abnormalities are a result of just choledocholithiasis and biliary obstruction.  With her imaging suggestive of splenomegaly as well as some nodularity to her liver, underlying cirrhosis could also be playing some role in regards to  her potential workup etiology of symptoms.  She is at risk of metabolic associated steatotic liver disease or in the setting of her elevated ANA and immunoglobulins, the possibility of an underlying autoimmune hepatitis should still be kept in mind, though anti-smooth muscle antibody was normal.  We discussed the potential need of a liver biopsy at some point in time should liver biochemical testing not  prove.  Will plan to do full endoscopy and then ERCP for attempted stone extraction.  Our goal and hope is to do this later today pending or availability from an anesthesia standpoint otherwise procedure will be done on Sunday.  The risks of an ERCP were discussed at length, including but not limited to the risk of perforation, bleeding, abdominal pain, post-ERCP pancreatitis (while usually mild can be severe and even life threatening).  The risks and benefits of endoscopic evaluation were discussed with the patient; these include but are not limited to the risk of perforation, infection, bleeding, missed lesions, lack of diagnosis, severe illness requiring hospitalization, as well as anesthesia and sedation related illnesses.  The patient and/or family is agreeable to proceed.   All patient questions were answered to the best of my ability, and the patient agrees to the aforementioned plan of action with follow-up as indicated.   PLAN/RECOMMENDATIONS  Please maintain n.p.o. status 10 mg p.o. vitamin K for INR 1.3 Plan for EGD/ERCP later today pending availability in endoscopy unit Consideration of liver biopsy at some point in future pending liver biochemical testing post management choledocholithiasis   Please page/call with questions or concerns.   Robin Finner, MD [facility] Office # [phone]   1:30 PM addendum This patient's procedure will have to be postponed until Sunday, she will be first case on Sunday morning on 10/26. Patient will be able to eat rest today and be n.p.o. at midnight

## 2024-03-01 ENCOUNTER — Encounter (HOSPITAL_COMMUNITY): Payer: Self-pay | Admitting: Gastroenterology

## 2024-03-01 DIAGNOSIS — K8050 Calculus of bile duct without cholangitis or cholecystitis without obstruction: Secondary | ICD-10-CM | POA: Diagnosis not present

## 2024-03-01 LAB — CBC WITH DIFFERENTIAL/PLATELET
Abs Immature Granulocytes: 0.02 K/uL (ref 0.00–0.07)
Basophils Absolute: 0 K/uL (ref 0.0–0.1)
Basophils Relative: 0 %
Eosinophils Absolute: 0 K/uL (ref 0.0–0.5)
Eosinophils Relative: 0 %
HCT: 35.1 % — ABNORMAL LOW (ref 36.0–46.0)
Hemoglobin: 10.6 g/dL — ABNORMAL LOW (ref 12.0–15.0)
Immature Granulocytes: 0 %
Lymphocytes Relative: 18 %
Lymphs Abs: 1.1 K/uL (ref 0.7–4.0)
MCH: 30.1 pg (ref 26.0–34.0)
MCHC: 30.2 g/dL (ref 30.0–36.0)
MCV: 99.7 fL (ref 80.0–100.0)
Monocytes Absolute: 0.3 K/uL (ref 0.1–1.0)
Monocytes Relative: 5 %
Neutro Abs: 4.8 K/uL (ref 1.7–7.7)
Neutrophils Relative %: 77 %
Platelets: 170 K/uL (ref 150–400)
RBC: 3.52 MIL/uL — ABNORMAL LOW (ref 3.87–5.11)
RDW: 15.2 % (ref 11.5–15.5)
WBC: 6.3 K/uL (ref 4.0–10.5)
nRBC: 0 % (ref 0.0–0.2)

## 2024-03-01 LAB — COMPREHENSIVE METABOLIC PANEL WITH GFR
ALT: 48 U/L — ABNORMAL HIGH (ref 0–44)
AST: 92 U/L — ABNORMAL HIGH (ref 15–41)
Albumin: 2.2 g/dL — ABNORMAL LOW (ref 3.5–5.0)
Alkaline Phosphatase: 625 U/L — ABNORMAL HIGH (ref 38–126)
Anion gap: 9 (ref 5–15)
BUN: 22 mg/dL (ref 8–23)
CO2: 17 mmol/L — ABNORMAL LOW (ref 22–32)
Calcium: 7.6 mg/dL — ABNORMAL LOW (ref 8.9–10.3)
Chloride: 110 mmol/L (ref 98–111)
Creatinine, Ser: 1.28 mg/dL — ABNORMAL HIGH (ref 0.44–1.00)
GFR, Estimated: 42 mL/min — ABNORMAL LOW (ref >60)
Glucose, Bld: 131 mg/dL — ABNORMAL HIGH (ref 70–99)
Potassium: 4.6 mmol/L (ref 3.5–5.1)
Sodium: 136 mmol/L (ref 135–145)
Total Bilirubin: 1.9 mg/dL — ABNORMAL HIGH (ref 0.0–1.2)
Total Protein: 5.4 g/dL — ABNORMAL LOW (ref 6.5–8.1)

## 2024-03-01 LAB — MAGNESIUM: Magnesium: 2 mg/dL (ref 1.7–2.4)

## 2024-03-01 MED ORDER — CARVEDILOL 3.125 MG PO TABS
3.1250 mg | ORAL_TABLET | Freq: Two times a day (BID) | ORAL | Status: DC
  Filled 2024-03-01: qty 1

## 2024-03-01 MED ORDER — CARVEDILOL 3.125 MG PO TABS: 3.1250 mg | ORAL_TABLET | Freq: Two times a day (BID) | ORAL | 2 refills | Status: DC

## 2024-03-01 MED ORDER — ENSURE PLUS HIGH PROTEIN PO LIQD
237.0000 mL | Freq: Two times a day (BID) | ORAL | Status: DC
  Administered 2024-03-01: 237 mL via ORAL

## 2024-03-01 NOTE — Progress Notes (Addendum)
 DC instructions and packet given to pt and family at bedside. Packet printed in english and spanish. Al questions answered. IV removed.

## 2024-03-01 NOTE — Plan of Care (Signed)

## 2024-03-01 NOTE — Discharge Summary (Signed)
 Physician Discharge Summary  Robin Hayes FMW:[id number] DOB: 1940/09/24 DOA: 02/27/2024  PCP: Patient, No Pcp Per  Admit date: 02/27/2024 Discharge date: 03/01/2024  Admitted From: Home Disposition: Home  Recommendations for Outpatient Follow-up:  Follow up with PCP in 1-2 weeks Please obtain CMP/CBC in one week GI to schedule follow-up  Discharge Condition: Stable CODE STATUS: Full code Diet recommendation: Regular diet  Discharge summary: 83 year old with history of hypertension, history of cholecystectomy presented with intermittent waxing and waning abdominal pain, followed by gastroenterology as outpatient, right upper quadrant ultrasound showed dilated common bile duct.  Ultimately underwent MRCP and found to have choledocholithiasis.  She was sent to the hospital for ERCP.  Admitted and underwent ERCP, sphincterotomy and stone extraction and plastic stent placement.  Clinically and biochemically improved after the procedure.  On endoscopy procedure, patient was also found to have evidence of portal hypertension and gastropathy.  GI recommended to restart on beta-blockers and they will schedule follow-up as outpatient for repeat endoscopy and possibly banding.  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 Clinically improving.  Adequate bowel function.  LFTs improving. GI to schedule outpatient follow-up.   Hypertension: Blood pressure stable.  Patient was on lisinopril at home.  Starting on carvedilol.  Recommended to discontinue lisinopril as starting on carvedilol 3.125 mg twice daily.     Hyperlipidemia: On Crestor.  On hold until LFTs improved.  Will hold for 1 week.  Stable for discharge to go home with outpatient follow-up.  Patient went home with instructions to continue her home lisinopril as well as new prescription of carvedilol.  I called her daughter  Vivian Portilla and advised to discontinue lisinopril.   Discharge Diagnoses:  Principal Problem:   Choledocholithiasis Active Problems:   HTN (hypertension)   Dyslipidemia   Obstructive jaundice (HCC)   Dilated bile duct   Splenomegaly   Thrombocytopenia   Gastritis without bleeding   Other cirrhosis of liver [facility])    Discharge Instructions  Discharge Instructions     Call MD for:  persistant nausea and vomiting   Complete by: As directed    Call MD for:  severe uncontrolled pain   Complete by: As directed    Diet - low sodium heart healthy   Complete by: As directed    Increase activity slowly   Complete by: As directed       Allergies as of 03/01/2024   No Known Allergies      Medication List     PAUSE taking these medications    rosuvastatin 20 MG tablet Wait to take this until: March 08, 2024 Commonly known as: CRESTOR Take 20 mg by mouth daily.       STOP taking these medications    gabapentin  100 MG capsule Commonly known as: NEURONTIN    lisinopril 30 MG tablet Commonly known as: ZESTRIL       TAKE these medications    carvedilol 3.125 MG tablet Commonly known as: COREG Take 1 tablet (3.125 mg total) by mouth 2 (two) times daily with a meal.   famotidine 20 MG tablet Commonly known as: PEPCID Take 20 mg by mouth 2 (two) times daily.   Fish Oil 1000 MG Caps Take 1,000 mg by mouth daily.   magnesium gluconate 500 MG tablet Commonly known as: MAGONATE Take 500 mg by mouth daily.   polyethylene glycol powder 17 GM/SCOOP powder Commonly known as: GLYCOLAX/MIRALAX Take 17 g by mouth daily.  Vitamin D (Ergocalciferol) 1.25 MG (50000 UNIT) Caps capsule Commonly known as: DRISDOL Take 50,000 Units by mouth every 7 (seven) days.   VITAMIN K2-VITAMIN D3 PO Take by mouth daily.        No Known Allergies  Consultations: Gastroenterology   Procedures/Studies: DG ERCP Result Date: 03/01/2024 CLINICAL DATA:  [id number]  Surgery, elective [id number] pancreatitis. Choledocholithiasis. EXAM: ERCP COMPARISON:  MRCP, 02/24/2024. FLUOROSCOPY: Exposure Index (as provided by the fluoroscopic device): 67.3 mGy Kerma FINDINGS: Limited oblique planar images of the RIGHT upper quadrant obtained C-arm. Images demonstrating flexible endoscopy, biliary duct cannulation, sphincterotomy, retrograde cholangiogram and balloon sweep. Pancreatic stent placement. Mild extrahepatic biliary ductal and a biliary filling defect are demonstrated. IMPRESSION: Fluoroscopic imaging for ERCP and pancreatic stent placement. Choledocholithiasis with mild extrahepatic biliary ductal dilatation. For complete description of intra procedural findings, please see performing service dictation. Electronically Signed   By: Thom Hall M.D.   On: 03/01/2024 10:03   DG C-Arm 1-60 Min-No Report Result Date: 02/29/2024 Fluoroscopy was utilized by the requesting physician.  No radiographic interpretation.   MR ABDOMEN MRCP W WO CONTAST Result Date: 02/26/2024 CLINICAL DATA:  Pancreatitis suspected, persistent pain and elevated LFTs EXAM: MRI ABDOMEN WITHOUT AND WITH CONTRAST (INCLUDING MRCP) TECHNIQUE: Multiplanar multisequence MR imaging of the abdomen was performed both before and after the administration of intravenous contrast. Heavily T2-weighted images of the biliary and pancreatic ducts were obtained, and three-dimensional MRCP images were rendered by post processing. CONTRAST:  6mL GADAVIST GADOBUTROL 1 MMOL/ML IV SOLN COMPARISON:  None Available. FINDINGS: Lower chest: No acute abnormality. Hepatobiliary: No solid liver abnormality is seen. Cholecystectomy. Severe intra and extrahepatic biliary ductal dilatation, the common bile duct measuring up to 1.6 cm in caliber. Choledocholithiasis, 0.7 cm calculus at the ampulla (series 3, image 16). Pancreas: Unremarkable. No pancreatic ductal dilatation or surrounding inflammatory changes. Spleen: Mild splenomegaly,  maximum coronal span 13.5 cm Adrenals/Urinary Tract: Adrenal glands are unremarkable. Kidneys are normal, without renal calculi, solid lesion, or hydronephrosis. Stomach/Bowel: Stomach is within normal limits. No evidence of bowel wall thickening, distention, or inflammatory changes. Vascular/Lymphatic: Aortic atherosclerosis. No enlarged abdominal lymph nodes. Other: No abdominal wall hernia or abnormality. Moderate volume ascites throughout the abdomen and pelvis. Musculoskeletal: No acute or significant osseous findings. IMPRESSION: 1. Choledocholithiasis, 0.7 cm calculus at the ampulla. 2. Severe intra and extrahepatic biliary ductal dilatation, the common bile duct measuring up to 1.6 cm in caliber. 3. Cholecystectomy. 4. Moderate volume ascites throughout the abdomen and pelvis. 5. Mild splenomegaly. These results will be called to the ordering clinician or representative by the Radiologist Assistant, and communication documented in the PACS or [employer]. Aortic Atherosclerosis (ICD10-I70.0). Electronically Signed   By: Marolyn JONETTA Jaksch M.D.   On: 02/26/2024 11:44   MR 3D Recon At Scanner Result Date: 02/26/2024 CLINICAL DATA:  Pancreatitis suspected, persistent pain and elevated LFTs EXAM: MRI ABDOMEN WITHOUT AND WITH CONTRAST (INCLUDING MRCP) TECHNIQUE: Multiplanar multisequence MR imaging of the abdomen was performed both before and after the administration of intravenous contrast. Heavily T2-weighted images of the biliary and pancreatic ducts were obtained, and three-dimensional MRCP images were rendered by post processing. CONTRAST:  6mL GADAVIST GADOBUTROL 1 MMOL/ML IV SOLN COMPARISON:  None Available. FINDINGS: Lower chest: No acute abnormality. Hepatobiliary: No solid liver abnormality is seen. Cholecystectomy. Severe intra and extrahepatic biliary ductal dilatation, the common bile duct measuring up to 1.6 cm in caliber. Choledocholithiasis, 0.7 cm calculus at the ampulla (series 3, image 16).  Pancreas: Unremarkable. No  pancreatic ductal dilatation or surrounding inflammatory changes. Spleen: Mild splenomegaly, maximum coronal span 13.5 cm Adrenals/Urinary Tract: Adrenal glands are unremarkable. Kidneys are normal, without renal calculi, solid lesion, or hydronephrosis. Stomach/Bowel: Stomach is within normal limits. No evidence of bowel wall thickening, distention, or inflammatory changes. Vascular/Lymphatic: Aortic atherosclerosis. No enlarged abdominal lymph nodes. Other: No abdominal wall hernia or abnormality. Moderate volume ascites throughout the abdomen and pelvis. Musculoskeletal: No acute or significant osseous findings. IMPRESSION: 1. Choledocholithiasis, 0.7 cm calculus at the ampulla. 2. Severe intra and extrahepatic biliary ductal dilatation, the common bile duct measuring up to 1.6 cm in caliber. 3. Cholecystectomy. 4. Moderate volume ascites throughout the abdomen and pelvis. 5. Mild splenomegaly. These results will be called to the ordering clinician or representative by the Radiologist Assistant, and communication documented in the PACS or [employer]. Aortic Atherosclerosis (ICD10-I70.0). Electronically Signed   By: Marolyn JONETTA Jaksch M.D.   On: 02/26/2024 11:44   (Echo, Carotid, EGD, Colonoscopy, ERCP)    Subjective: Patient seen and examined.  Daughter at the bedside Home patient requested to translate for her.  Patient denies any complaints.  Drinking Ensure.  Ready to go home.  Mobilize around.  Denies any dizziness or lightheadedness.   Discharge Exam: Vitals:   02/29/24 1829 03/01/24 0359  BP: (!) 116/52 (!) 103/46  Pulse: 64 66  Resp: 18 16  Temp: 98.3 F (36.8 C) 97.6 F (36.4 C)  SpO2: 97% 96%   Vitals:   02/29/24 1000 02/29/24 1422 02/29/24 1829 03/01/24 0359  BP: 127/62 119/63 (!) 116/52 (!) 103/46  Pulse: 77 81 64 66  Resp: (!) 21 17 18 16   Temp: 97.6 F (36.4 C) (!) 97.5 F (36.4 C) 98.3 F (36.8 C) 97.6 F (36.4 C)  TempSrc:    Oral  SpO2: 94%  97% 97% 96%  Weight:      Height:        General: Pt is alert, awake, not in acute distress Cardiovascular: RRR, S1/S2 +, no rubs, no gallops Respiratory: CTA bilaterally, no wheezing, no rhonchi Abdominal: Soft, NT, ND, bowel sounds + Extremities: no edema, no cyanosis    The results of significant diagnostics from this hospitalization (including imaging, microbiology, ancillary and laboratory) are listed below for reference.     Microbiology: No results found for this or any previous visit (from the past 240 hours).   Labs: BNP (last 3 results) No results for input(s): BNP in the last 8760 hours. Basic Metabolic Panel: Recent Labs  Lab 02/27/24 1715 02/28/24 0335 03/01/24 0435  NA 136 135 136  K 5.2* 4.4 4.6  CL 103 107 110  CO2 23 22 17*  GLUCOSE 100* 90 131*  BUN 15 15 22   CREATININE 0.76 0.63 1.28*  CALCIUM 8.8* 7.6* 7.6*  MG  --   --  2.0   Liver Function Tests: Recent Labs  Lab 02/27/24 1715 02/28/24 0335 03/01/24 0435  AST 262* 161* 92*  ALT 96* 61* 48*  ALKPHOS 1,015* 687* 625*  BILITOT 4.9* 2.6* 1.9*  PROT 7.5 5.3* 5.4*  ALBUMIN 3.0* 2.1* 2.2*   Recent Labs  Lab 02/27/24 1715  LIPASE 10*   No results for input(s): AMMONIA in the last 168 hours. CBC: Recent Labs  Lab 02/27/24 1715 02/28/24 0335 03/01/24 0435  WBC 9.3 5.9 6.3  NEUTROABS  --   --  4.8  HGB 13.3 9.5* 10.6*  HCT 41.6 30.4* 35.1*  MCV 96.7 97.1 99.7  PLT 250 136* 170  Cardiac Enzymes: No results for input(s): CKTOTAL, CKMB, CKMBINDEX, TROPONINI in the last 168 hours. BNP: Invalid input(s): POCBNP CBG: No results for input(s): GLUCAP in the last 168 hours. D-Dimer No results for input(s): DDIMER in the last 72 hours. Hgb A1c No results for input(s): HGBA1C in the last 72 hours. Lipid Profile No results for input(s): CHOL, HDL, LDLCALC, TRIG, CHOLHDL, LDLDIRECT in the last 72 hours. Thyroid function studies No results for input(s):  TSH, T4TOTAL, T3FREE, THYROIDAB in the last 72 hours.  Invalid input(s): FREET3 Anemia work up No results for input(s): VITAMINB12, FOLATE, FERRITIN, TIBC, IRON, RETICCTPCT in the last 72 hours. Urinalysis    Component Value Date/Time   COLORURINE AMBER (A) 02/27/2024 1728   APPEARANCEUR HAZY (A) 02/27/2024 1728   LABSPEC 1.025 02/27/2024 1728   PHURINE 5.0 02/27/2024 1728   GLUCOSEU NEGATIVE 02/27/2024 1728   HGBUR NEGATIVE 02/27/2024 1728   BILIRUBINUR MODERATE (A) 02/27/2024 1728   KETONESUR NEGATIVE 02/27/2024 1728   PROTEINUR NEGATIVE 02/27/2024 1728   NITRITE NEGATIVE 02/27/2024 1728   LEUKOCYTESUR NEGATIVE 02/27/2024 1728   Sepsis Labs Recent Labs  Lab 02/27/24 1715 02/28/24 0335 03/01/24 0435  WBC 9.3 5.9 6.3   Microbiology No results found for this or any previous visit (from the past 240 hours).   Time coordinating discharge: 35 minutes  SIGNED:   Renato Applebaum, MD  [facility] 03/01/2024, 12:24 PM

## 2024-03-01 NOTE — Progress Notes (Signed)
 Progress Note  Primary GI: Dr. Wilhelmenia  LOS: 3 days   Chief Complaint: Choledocholithiasis and cirrhosis   Subjective   Daughter present at bedside who provides translation.  Patient states she is feeling significantly improved with absolutely no pain today.  She is ambulating well.  She is on a full liquid diet and would like to advance.  She would also like to go home today if possible. Her urine is reportedly back to normal and is less dark   Objective   Vital signs in last 24 hours: Temp:  [97.5 F (36.4 C)-98.3 F (36.8 C)] 97.6 F (36.4 C) (10/27 0359) Pulse Rate:  [64-81] 66 (10/27 0359) Resp:  [16-18] 16 (10/27 0359) BP: (103-119)/(46-63) 103/46 (10/27 0359) SpO2:  [96 %-97 %] 96 % (10/27 0359) Last BM Date : 02/29/24 Last BM recorded by nurses in past 5 days No data recorded  General:   female in no acute distress Heart:  Regular rate and rhythm; no murmurs Pulm: Clear anteriorly; no wheezing Abdomen: soft, nondistended, normal bowel sounds in all quadrants. Nontender without guarding. No organomegaly appreciated. Extremities:  No edema Neurologic:  Alert and  oriented x4;  No focal deficits.  Psych:  Cooperative. Normal mood and affect.  Intake/Output from previous day: 10/26 0701 - 10/27 0700 In: 740 [P.O.:240; I.V.:500] Out: -  Intake/Output this shift: No intake/output data recorded.  Studies/Results: DG C-Arm 1-60 Min-No Report Result Date: 02/29/2024 Fluoroscopy was utilized by the requesting physician.  No radiographic interpretation.    Lab Results: Recent Labs    02/27/24 1715 02/28/24 0335 03/01/24 0435  WBC 9.3 5.9 6.3  HGB 13.3 9.5* 10.6*  HCT 41.6 30.4* 35.1*  PLT 250 136* 170   BMET Recent Labs    02/27/24 1715 02/28/24 0335 03/01/24 0435  NA 136 135 136  K 5.2* 4.4 4.6  CL 103 107 110  CO2 23 22 17*  GLUCOSE 100* 90 131*  BUN 15 15 22   CREATININE 0.76 0.63 1.28*  CALCIUM 8.8* 7.6* 7.6*   LFT Recent Labs     03/01/24 0435  PROT 5.4*  ALBUMIN 2.2*  AST 92*  ALT 48*  ALKPHOS 625*  BILITOT 1.9*   PT/INR Recent Labs    02/28/24 0335  LABPROT 16.9*  INR 1.3*     Scheduled Meds:  carvedilol  3.125 mg Oral BID WC   famotidine  20 mg Oral BID   feeding supplement  237 mL Oral BID BM   pantoprazole  40 mg Oral Daily   Continuous Infusions:    Patient profile:   83 y.o. female with past medical history of cholecystectomy, hypertension, hyperlipidemia who was recently seen in our office for evaluation of elevated liver enzymes and presented to ED after having an ultrasound showing dilated common bile duct  ERCP 02/29/2024 - No gross lesions in the proximal esophagus. 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  - Z- line irregular, 38 cm from the incisors.  - Portal hypertensive gastropathy.  - Erythematous mucosa in the stomach. Biopsied.  - No gross lesions in the duodenal bulb, in the first portion of the duodenum and in the second portion of the duodenum.  - The major papilla appeared congested.  - Difficult cannulation eventually requiring biliary fistulotomy precut to access the biliary tree as documented above.  - One temporary plastic pancreatic stent was placed into the ventral pancreatic duct.  - After biliary cannulation was  obtained, a filling defect consistent with a stone and sludge was seen on the cholangiogram.  - The entire biliary tree was severely dilated.  - Choledocholithiasis was found. Complete removal was accomplished by biliary sphincterotomy/ fistulotomy and balloon sweeping.   Impression/Plan:   Obstructive jaundice secondary to choledocholithiasis MRCP 10/23 with choledocholithiasis and significant biliary duct dilation as well as splenomegaly and ascites S/p ERCP 10/26 with sphincterotomy and plastic stent placement  Liver enzymes improving Abdominal pain improving -- Continue to monitor LFTs - KUB in 10  to 14 days to evaluate pancreatic stent, if present will need to be scheduled for EGD with stent pull  Cirrhosis with portal hypertension MRCP 02/26/2024 with splenomegaly and concern for possible cirrhosis. ERCP 10/26 with  Grade I, II, and III varices Unable to tolerate carvedilol secondary to persistent hypotension Elevated ANA and immunoglobulins with normal ASMA, though also high risk for suspected metabolic associated steatotic liver disease - will ultimately need to be on carvedilol if she can tolerate it to decrease risk of bleeding, currently hypotensive so holding medication - Will need esophageal banding for varices when able - Consider paracentesis with fluid analysis to calculate SAAG - Continue further workup outpatient with consideration for liver biopsy to identify etiology of cirrhosis  Hypertension Antihypertensives on hold  Chaim Gatley M Lyndon Chenoweth  03/01/2024, 10:01 AM

## 2024-03-01 NOTE — Plan of Care (Signed)

## 2024-03-03 ENCOUNTER — Ambulatory Visit: Payer: Self-pay | Admitting: Gastroenterology

## 2024-03-03 LAB — SURGICAL PATHOLOGY

## 2024-03-09 ENCOUNTER — Telehealth: Payer: Self-pay | Admitting: Gastroenterology

## 2024-03-09 NOTE — Telephone Encounter (Signed)
 Left message on machine to call back     Margean Korell         03/03/2024 [address] [place] [address]          Dear Ms. Hedger,  I am writing to inform you that the pathology taken during your recent endoscopic examination showed:   FINAL MICROSCOPIC DIAGNOSIS:  A. GASTRIC BIOPSY:  - Chronic inactive gastritis with intestinal metaplasia  - Negative for H. pylori on HE stain  - Negative for dysplasia or malignancy    What does this all mean? The stomach biopsies show evidence of chronic inflammation.  There are also some changes in some of the biopsy specimens known as intestinal metaplasia.  Intestinal metaplasia, is a process where-in-which the stomach lining begins to change and began to look more like small intestine tissue lining.  This can be a risk factor for certain individuals for the development of stomach cancer in the future.  No evidence of cancer is found on today's biopsies however.  Individuals who have intestinal metaplasia are recommended to have surveillance endoscopies periodically.  I would recommend that we repeat your upper endoscopy in 6 months.  At that time we will plan to do what we call up gastric mapping biopsies, where we take biopsies from the 6 different regions of the stomach and put them in different jars to see how extensive the intestinal metaplasia is.  This then helps us  decide how frequently we would recommend an upper endoscopy in an effort of trying to prevent you from ever developing stomach cancer in the future.  Most often you will have your upper endoscopy every 2 to 3 years for monitoring.  We will need to make decisions longer-term in regards to the underlying liver disease that you have (Cirrhosis).  We have you set up to see Camie Furbish PA on 04/14/24 at 10 am at [address] [facility] 3 rd floor.   Please call us  at [phone] if you have persistent problems or have questions about your condition that have not  been fully answered at this time.  Sincerely,

## 2024-03-09 NOTE — Telephone Encounter (Signed)
 The pt daughter returned call and we discussed the pathology letter in detail. No further questions or concerns at this time. She is aware of the appt with Camie 12/10.  She will call back if she has any further concerns or questions.

## 2024-03-09 NOTE — Telephone Encounter (Signed)
 Inbound call from patient's daughter requesting a call to discuss results and next steps in plan of care further. Also states patient is still experiencing bloating. Please advise, thank you.

## 2024-03-15 ENCOUNTER — Telehealth: Payer: Self-pay

## 2024-03-15 DIAGNOSIS — T85528A Displacement of other gastrointestinal prosthetic devices, implants and grafts, initial encounter: Secondary | ICD-10-CM

## 2024-03-15 NOTE — Telephone Encounter (Signed)
 Left message on machine to call back

## 2024-03-15 NOTE — Telephone Encounter (Signed)
-----   Message from [facility] sent at 02/29/2024  9:41 AM EDT ----- Regarding: PD stent followup Shamaine Mulkern, This patient needs KUB in 2-weeks for PD stent followup post ERCP. Thanks. GM

## 2024-03-16 NOTE — Telephone Encounter (Signed)
Left message on caregiver voicemail.

## 2024-03-17 NOTE — Telephone Encounter (Signed)
 Several messages left using interpreter service.  No answer and no return call will mail letter

## 2024-03-22 ENCOUNTER — Inpatient Hospital Stay (HOSPITAL_BASED_OUTPATIENT_CLINIC_OR_DEPARTMENT_OTHER)
Admission: EM | Admit: 2024-03-22 | Discharge: 2024-03-25 | DRG: 433 | Disposition: A | Discharge status: Home / Self Care | Attending: Internal Medicine | Admitting: Internal Medicine

## 2024-03-22 ENCOUNTER — Other Ambulatory Visit: Payer: Self-pay

## 2024-03-22 ENCOUNTER — Emergency Department (HOSPITAL_BASED_OUTPATIENT_CLINIC_OR_DEPARTMENT_OTHER)

## 2024-03-22 ENCOUNTER — Telehealth: Payer: Self-pay

## 2024-03-22 ENCOUNTER — Ambulatory Visit (INDEPENDENT_AMBULATORY_CARE_PROVIDER_SITE_OTHER)
Admission: RE | Admit: 2024-03-22 | Discharge: 2024-03-22 | Disposition: A | Discharge status: Home / Self Care | Source: Ambulatory Visit | Attending: Gastroenterology | Admitting: Gastroenterology

## 2024-03-22 DIAGNOSIS — E441 Mild protein-calorie malnutrition: Secondary | ICD-10-CM | POA: Diagnosis present

## 2024-03-22 DIAGNOSIS — T85528A Displacement of other gastrointestinal prosthetic devices, implants and grafts, initial encounter: Secondary | ICD-10-CM | POA: Diagnosis not present

## 2024-03-22 DIAGNOSIS — E7800 Pure hypercholesterolemia, unspecified: Secondary | ICD-10-CM | POA: Diagnosis present

## 2024-03-22 DIAGNOSIS — I8510 Secondary esophageal varices without bleeding: Secondary | ICD-10-CM | POA: Diagnosis present

## 2024-03-22 DIAGNOSIS — Z79899 Other long term (current) drug therapy: Secondary | ICD-10-CM

## 2024-03-22 DIAGNOSIS — Z9049 Acquired absence of other specified parts of digestive tract: Secondary | ICD-10-CM

## 2024-03-22 DIAGNOSIS — K766 Portal hypertension: Secondary | ICD-10-CM | POA: Diagnosis present

## 2024-03-22 DIAGNOSIS — R7989 Other specified abnormal findings of blood chemistry: Secondary | ICD-10-CM | POA: Diagnosis present

## 2024-03-22 DIAGNOSIS — K7581 Nonalcoholic steatohepatitis (NASH): Secondary | ICD-10-CM | POA: Diagnosis present

## 2024-03-22 DIAGNOSIS — K219 Gastro-esophageal reflux disease without esophagitis: Secondary | ICD-10-CM | POA: Diagnosis present

## 2024-03-22 DIAGNOSIS — E785 Hyperlipidemia, unspecified: Secondary | ICD-10-CM | POA: Diagnosis present

## 2024-03-22 DIAGNOSIS — K7469 Other cirrhosis of liver: Principal | ICD-10-CM | POA: Diagnosis present

## 2024-03-22 DIAGNOSIS — R188 Other ascites: Principal | ICD-10-CM | POA: Diagnosis present

## 2024-03-22 DIAGNOSIS — I10 Essential (primary) hypertension: Secondary | ICD-10-CM | POA: Diagnosis present

## 2024-03-22 DIAGNOSIS — D649 Anemia, unspecified: Secondary | ICD-10-CM | POA: Diagnosis present

## 2024-03-22 DIAGNOSIS — Z6831 Body mass index (BMI) 31.0-31.9, adult: Secondary | ICD-10-CM

## 2024-03-22 DIAGNOSIS — Z66 Do not resuscitate: Secondary | ICD-10-CM | POA: Diagnosis present

## 2024-03-22 LAB — URINALYSIS, W/ REFLEX TO CULTURE (INFECTION SUSPECTED)
Bacteria, UA: NONE SEEN
Bilirubin Urine: NEGATIVE
Glucose, UA: NEGATIVE mg/dL
Hgb urine dipstick: NEGATIVE
Ketones, ur: NEGATIVE mg/dL
Leukocytes,Ua: NEGATIVE
Nitrite: NEGATIVE
Specific Gravity, Urine: >1.046 — ABNORMAL HIGH (ref 1.005–1.030)
pH: 6 (ref 5.0–8.0)

## 2024-03-22 LAB — CBC WITH DIFFERENTIAL/PLATELET
Abs Immature Granulocytes: 0.02 K/uL (ref 0.00–0.07)
Basophils Absolute: 0.1 K/uL (ref 0.0–0.1)
Basophils Relative: 1 %
Eosinophils Absolute: 0.3 K/uL (ref 0.0–0.5)
Eosinophils Relative: 5 %
HCT: 39.9 % (ref 36.0–46.0)
Hemoglobin: 13.3 g/dL (ref 12.0–15.0)
Immature Granulocytes: 0 %
Lymphocytes Relative: 39 %
Lymphs Abs: 2.6 K/uL (ref 0.7–4.0)
MCH: 30.6 pg (ref 26.0–34.0)
MCHC: 33.3 g/dL (ref 30.0–36.0)
MCV: 91.7 fL (ref 80.0–100.0)
Monocytes Absolute: 0.4 K/uL (ref 0.1–1.0)
Monocytes Relative: 6 %
Neutro Abs: 3.3 K/uL (ref 1.7–7.7)
Neutrophils Relative %: 49 %
Platelets: 195 K/uL (ref 150–400)
RBC: 4.35 MIL/uL (ref 3.87–5.11)
RDW: 13.4 % (ref 11.5–15.5)
WBC: 6.7 K/uL (ref 4.0–10.5)
nRBC: 0 % (ref 0.0–0.2)

## 2024-03-22 LAB — COMPREHENSIVE METABOLIC PANEL WITH GFR
ALT: 21 U/L (ref 0–44)
AST: 57 U/L — ABNORMAL HIGH (ref 15–41)
Albumin: 3 g/dL — ABNORMAL LOW (ref 3.5–5.0)
Alkaline Phosphatase: 232 U/L — ABNORMAL HIGH (ref 38–126)
Anion gap: 10 (ref 5–15)
BUN: 11 mg/dL (ref 8–23)
CO2: 24 mmol/L (ref 22–32)
Calcium: 8.8 mg/dL — ABNORMAL LOW (ref 8.9–10.3)
Chloride: 104 mmol/L (ref 98–111)
Creatinine, Ser: 0.61 mg/dL (ref 0.44–1.00)
GFR, Estimated: >60 mL/min (ref >60)
Glucose, Bld: 112 mg/dL — ABNORMAL HIGH (ref 70–99)
Potassium: 4.8 mmol/L (ref 3.5–5.1)
Sodium: 137 mmol/L (ref 135–145)
Total Bilirubin: 1.4 mg/dL — ABNORMAL HIGH (ref 0.0–1.2)
Total Protein: 7 g/dL (ref 6.5–8.1)

## 2024-03-22 LAB — LACTIC ACID, PLASMA: Lactic Acid, Venous: 1.8 mmol/L (ref 0.5–1.9)

## 2024-03-22 LAB — LIPASE, BLOOD: Lipase: 13 U/L (ref 11–51)

## 2024-03-22 MED ORDER — HYDROXYZINE HCL 25 MG PO TABS
25.0000 mg | ORAL_TABLET | Freq: Once | ORAL | Status: AC
  Administered 2024-03-22: 25 mg via ORAL
  Filled 2024-03-22: qty 1

## 2024-03-22 MED ORDER — IOHEXOL 300 MG/ML  SOLN
100.0000 mL | Freq: Once | INTRAMUSCULAR | Status: AC | PRN
  Administered 2024-03-22: 85 mL via INTRAVENOUS

## 2024-03-22 NOTE — ED Provider Notes (Signed)
 [place] [facility]ERGENC[facility] DEPARTMENT AT [facility] Provider Note   CSN: [id number] Arrival date & time: 03/22/24  1728     Patient presents with: Post-op Problem   Robin Hayes is a 83 y.o. female with past medical history of hypertension, dyslipidemia, gastritis and surgical history of cholecystectomy who presents emergency department for evaluation of abdominal pain and swelling after ERCP 2 weeks ago.  History primarily obtained from patient's daughter, who is at bedside who states the patient's abdomen is significantly swollen.  Patient states she is in some pain at this time and it is difficult for her to move around given the large size of her abdomen.  Patient was evaluated by her PCP earlier today who obtained an x-ray and advised the patient to come to the emergency department.  Patient has self-reported fevers, chills, body aches.  HPI     Prior to Admission medications   Medication Sig Start Date End Date Taking? Authorizing Provider  carvedilol (COREG) 3.125 MG tablet Take 1 tablet (3.125 mg total) by mouth 2 (two) times daily with a meal. Patient taking differently: Take 3.125 mg by mouth daily. 03/01/24 03/31/24 Yes Ghimire, Kuber, MD  rosuvastatin (CRESTOR) 20 MG tablet Take 20 mg by mouth daily. 01/22/24 07/20/24 Yes [provider]  famotidine (PEPCID) 20 MG tablet Take 20 mg by mouth 2 (two) times daily. Patient not taking: Reported on 03/23/2024 02/04/24   [provider]  magnesium gluconate (MAGONATE) 500 MG tablet Take 500 mg by mouth daily. Patient not taking: Reported on 03/23/2024    [provider]  Omega-3 Fatty Acids (FISH OIL) 1000 MG CAPS Take 1,000 mg by mouth daily. Patient not taking: Reported on 03/23/2024    [provider]  polyethylene glycol powder (GLYCOLAX/MIRALAX) 17 GM/SCOOP powder Take 17 g by mouth daily. Patient not taking: Reported on 03/23/2024    [provider]  Vitamin D-Vitamin K  (VITAMIN K2-VITAMIN D3 PO) Take by mouth daily. Patient not taking: Reported on 03/23/2024    [provider]    Allergies: Patient has no known allergies.    Review of Systems  Gastrointestinal:  Positive for abdominal distention.    Updated Vital Signs BP 134/61 (BP Location: Left Arm)   Pulse 85   Temp 98.3 F (36.8 C) (Oral)   Resp 17   SpO2 95%   Physical Exam Vitals and nursing note reviewed.  Constitutional:      Appearance: Normal appearance.  HENT:     Head: Normocephalic and atraumatic.     Mouth/Throat:     Mouth: Mucous membranes are moist.  Eyes:     General: No scleral icterus.       Right eye: No discharge.        Left eye: No discharge.     Conjunctiva/sclera: Conjunctivae normal.  Cardiovascular:     Rate and Rhythm: Normal rate and regular rhythm.     Pulses: Normal pulses.  Pulmonary:     Effort: Pulmonary effort is normal.     Breath sounds: Normal breath sounds.  Abdominal:     General: There is no distension.     Tenderness: There is no abdominal tenderness.  Musculoskeletal:        General: No deformity.     Cervical back: Normal range of motion.  Skin:    General: Skin is warm and dry.     Capillary Refill: Capillary refill takes less than 2 seconds.  Neurological:     Mental Status:  She is alert.     Motor: No weakness.  Psychiatric:        Mood and Affect: Mood normal.     (all labs ordered are listed, but only abnormal results are displayed) Labs Reviewed  URINALYSIS, W/ REFLEX TO CULTURE (INFECTION SUSPECTED) - Abnormal; Notable for the following components:      Result Value   Specific Gravity, Urine >1.046 (*)    Protein, ur TRACE (*)    All other components within normal limits  COMPREHENSIVE METABOLIC PANEL WITH GFR - Abnormal; Notable for the following components:   Glucose, Bld 112 (*)    Calcium 8.8 (*)    Albumin 3.0 (*)    AST 57 (*)    Alkaline Phosphatase 232 (*)    Total Bilirubin 1.4 (*)    All other  components within normal limits  CBC - Abnormal; Notable for the following components:   RBC 3.77 (*)    Hemoglobin 11.8 (*)    HCT 35.0 (*)    All other components within normal limits  HEPATIC FUNCTION PANEL - Abnormal; Notable for the following components:   Total Protein 6.2 (*)    Albumin 2.8 (*)    Alkaline Phosphatase 192 (*)    Bilirubin, Direct 0.8 (*)    All other components within normal limits  BASIC METABOLIC PANEL WITH GFR - Abnormal; Notable for the following components:   Calcium 8.5 (*)    All other components within normal limits  PROTIME-INR - Abnormal; Notable for the following components:   Prothrombin Time 15.3 (*)    All other components within normal limits  AEROBIC/ANAEROBIC CULTURE W GRAM STAIN (SURGICAL/DEEP WOUND)  CBC WITH DIFFERENTIAL/PLATELET  LACTIC ACID, PLASMA  LIPASE, BLOOD  MAGNESIUM  PHOSPHORUS  BODY FLUID CELL COUNT WITH DIFFERENTIAL  ALBUMIN, PLEURAL OR PERITONEAL FLUID   PROTEIN, PLEURAL OR PERITONEAL FLUID  CBC  COMPREHENSIVE METABOLIC PANEL WITH GFR  CYTOLOGY - NON PAP    EKG: None  Radiology: CT ABDOMEN PELVIS W CONTRAST Result Date: 03/22/2024 CLINICAL DATA:  History of cholecystectomy and recent ERCP with stone found in bile duct, presenting with chills, tremor and abdominal and leg swelling. EXAM: CT ABDOMEN AND PELVIS WITH CONTRAST TECHNIQUE: Multidetector CT imaging of the abdomen and pelvis was performed using the standard protocol following bolus administration of intravenous contrast. RADIATION DOSE REDUCTION: This exam was performed according to the departmental dose-optimization program which includes automated exposure control, adjustment of the mA and/or kV according to patient size and/or use of iterative reconstruction technique. CONTRAST:  85mL OMNIPAQUE IOHEXOL 300 MG/ML  SOLN COMPARISON:  None Available. FINDINGS: Lower chest: Hazy, mild to moderate severity areas of atelectasis and/or infiltrate are seen within the  bilateral lung bases. Hepatobiliary: No focal liver abnormality is seen. Status post cholecystectomy. Air is noted throughout the common bile duct. Pancreas: Unremarkable. No pancreatic ductal dilatation or surrounding inflammatory changes. Spleen: Normal in size without focal abnormality. Adrenals/Urinary Tract: Adrenal glands are unremarkable. Kidneys are normal, without renal calculi, focal lesion, or hydronephrosis. A small amount of air is seen within the lumen of a poorly distended urinary bladder. Stomach/Bowel: There is a small hiatal hernia. The appendix is not clearly identified. No evidence of bowel wall thickening or bowel distention. Mild, hazy mesenteric inflammatory stranding is seen within the anterior aspect of the upper abdomen. Vascular/Lymphatic: Aortic atherosclerosis. No enlarged abdominal or pelvic lymph nodes. Reproductive: Uterus and bilateral adnexa are unremarkable. Other: There is mild anterior and lateral abdominal and  pelvic wall anasarca. A large amount of abdominopelvic ascites is seen. Musculoskeletal: Multilevel degenerative changes are noted within the lumbar spine, most prominent at the levels of L2-L3 and L3-L4. IMPRESSION: 1. Hazy, mild to moderate severity bibasilar atelectasis and/or infiltrate. 2. Large amount of abdominopelvic ascites. 3. Small hiatal hernia. 4. Status post cholecystectomy with air noted throughout the common bile duct. 5. Small amount of air within the lumen of a poorly distended urinary bladder. While this may represent sequelae associated with recent bladder instrumentation, correlation with urinalysis is recommended to exclude the presence of cystitis. 6. Mesenteric stranding within the anterior aspect of the mid and upper abdomen. While this may be inflammatory in origin, sequelae associated with a neoplastic process involving the omentum cannot be excluded. 7. Aortic atherosclerosis. Electronically Signed   By: Suzen Dials M.D.   On: 03/22/2024  21:16    .Critical Care  Performed by: Torrence Marry RAMAN, PA-C Authorized by: Torrence Marry RAMAN, PA-C   Critical care provider statement:    Critical care time (minutes):  50   Critical care was necessary to treat or prevent imminent or life-threatening deterioration of the following conditions:  Hepatic failure   Critical care was time spent personally by me on the following activities:  Discussions with consultants, development of treatment plan with patient or surrogate, blood draw for specimens, discussions with primary provider, evaluation of patient's response to treatment, examination of patient, obtaining history from patient or surrogate, review of old charts, re-evaluation of patient's condition, pulse oximetry, ordering and review of radiographic studies, ordering and review of laboratory studies and ordering and performing treatments and interventions   I assumed direction of critical care for this patient from another provider in my specialty: no     Care discussed with: admitting provider      Medications Ordered in the ED  ondansetron [facility]) tablet 4 mg (has no administration in time range)    Or  ondansetron (ZOFRAN) injection 4 mg (has no administration in time range)  carvedilol (COREG) tablet 3.125 mg (3.125 mg Oral Given 03/23/24 1652)  famotidine (PEPCID) tablet 20 mg (20 mg Oral Given 03/23/24 1155)  iohexol (OMNIPAQUE) 300 MG/ML solution 100 mL (85 mLs Intravenous Contrast Given 03/22/24 2002)  hydrOXYzine (ATARAX) tablet 25 mg (25 mg Oral Given 03/22/24 2237)  hydrOXYzine (ATARAX) tablet 25 mg (25 mg Oral Given 03/23/24 0700)  HYDROmorphone (DILAUDID) injection 0.5 mg (0.5 mg Intravenous Given 03/23/24 0730)  albumin human 25 % solution 12.5 g (12.5 g Intravenous New Bag/Given 03/23/24 1604)    Clinical Course as of 03/23/24 1716  Tue Mar 23, 2024  0040 Spoke with Dr. Charlton, Hospitalist, who will accept for admission.  [CS]    Clinical Course User  Index [CS] Roselyn Carlin NOVAK, MD                                Medical Decision Making Amount and/or Complexity of Data Reviewed Labs: ordered. Radiology: ordered.  Risk Prescription drug management. Decision regarding hospitalization.   This patient presents to the ED for concern of abdominal pain and swelling, this involves an extensive number of treatment options, and is a complaint that carries with it a high risk of complications and morbidity.  Differential diagnosis includes: Ascites, post ERCP pancreatic duct leak, post ERCP bile leak, biliary peritonitis, decompensated cirrhosis, congestive heart failure, malignancy related ascites, hemoperitoneum, SBP  Co morbidities:  ERCP procedure 2 weeks  ago  Social Determinants of Health:   not English-speaking  Additional history: Patient underwent ERCP procedure with Dr. Wilhelmenia on 10/26.  Patient was discharged on 10/27 with adequate bowel function and improving LFTs.  Plan was to follow-up with GI outpatient.  Lab Tests:  I Ordered, and personally interpreted labs.  The pertinent results include:    - AST: 232 (downtrending) - Bilirubin: 1.4 (downtrending) -   Imaging Studies:  I ordered imaging studies including CT abdomen pelvis I independently visualized and interpreted imaging which showed   1. Hazy, mild to moderate severity bibasilar atelectasis and/or  infiltrate.  2. Large amount of abdominopelvic ascites.  3. Small hiatal hernia.  4. Status post cholecystectomy with air noted throughout the common  bile duct.  5. Small amount of air within the lumen of a poorly distended  urinary bladder. While this may represent sequelae associated with  recent bladder instrumentation, correlation with urinalysis is  recommended to exclude the presence of cystitis.  6. Mesenteric stranding within the anterior aspect of the mid and  upper abdomen. While this may be inflammatory in origin, sequelae  associated with a  neoplastic process involving the omentum cannot be  excluded.  Large abdominal ascites noted  I agree with the radiologist interpretation  Cardiac Monitoring/ECG:  The patient was maintained on a cardiac monitor.  I personally viewed and interpreted the cardiac monitored which showed an underlying rhythm of: Sinus rhythm  Medicines ordered and prescription drug management:  I ordered medication including  Medications  ondansetron (ZOFRAN) tablet 4 mg (has no administration in time range)    Or  ondansetron (ZOFRAN) injection 4 mg (has no administration in time range)  carvedilol (COREG) tablet 3.125 mg (3.125 mg Oral Given 03/23/24 1652)  famotidine (PEPCID) tablet 20 mg (20 mg Oral Given 03/23/24 1155)  iohexol (OMNIPAQUE) 300 MG/ML solution 100 mL (85 mLs Intravenous Contrast Given 03/22/24 2002)  hydrOXYzine (ATARAX) tablet 25 mg (25 mg Oral Given 03/22/24 2237)  hydrOXYzine (ATARAX) tablet 25 mg (25 mg Oral Given 03/23/24 0700)  HYDROmorphone (DILAUDID) injection 0.5 mg (0.5 mg Intravenous Given 03/23/24 0730)  albumin human 25 % solution 12.5 g (12.5 g Intravenous New Bag/Given 03/23/24 1604)   for anxiety Reevaluation of the patient after these medicines showed that the patient improved I have reviewed the patients home medicines and have made adjustments as needed  Test Considered:   none  Critical Interventions:   multiple consultations  Consultations Obtained: - Gastroenterology - Hospitalist  Problem List / ED Course:     ICD-10-CM   1. Other ascites  R18.8 Cytology - Non PAP;    Cytology - Non PAP;    Body fluid cell count with differential    Body fluid cell count with differential    Albumin, pleural or peritoneal fluid     Albumin, pleural or peritoneal fluid     Protein, pleural or peritoneal fluid    Protein, pleural or peritoneal fluid    Aerobic/Anaerobic Culture w Gram Stain (surgical/deep wound)    Aerobic/Anaerobic Culture w Gram Stain  (surgical/deep wound)    CANCELED: Gram stain    CANCELED: Gram stain      MDM: 83 year old female who presents emergency department for evaluation of abdominal pain and ascites.  Patient has a remote history of cholecystectomy and approximately 2 weeks ago underwent an ERCP for a stone in her bile duct.  Patient was evaluated at her PCP for routine follow-up today.  She was notably distended  at that time.  Per chart review, patient was requesting imaging at the time of her PCP appointment today.  She did undergo abdominal x-ray.  I personally looked at and interpreted the images and it is notable for significant abdominal distention.  The radiology read is not currently in the system.  Patient's lab work is notable for alkaline phosphatase of 232, however this is downtrending based on her recent lab work on 10/27.  Her bilirubin is also elevated at 1.4, however this is also downtrending from her previous lab work on 10/27.  At this time, patient is not requesting any pain medicine.  CT of patient's abdomen is notable for large amount of abdominopelvic ascites as well as small amount of air in a poorly distended urinary bladder which may be consistent with possible cystitis.  Patient's UA shows no evidence of leuk esterase or nitrites.  I have placed a consult to GI for further recommendations.  I spoke with Dr. Nandigam with [facility] who recommended patient be admitted.  I then put in a consult to hospitalist.  At this time, I have given handoff to Francis Fleeting PA-C who is awaiting hospitalist follow-up.  Dispostion:  After consideration of the diagnostic results and the patients response to treatment, I feel that the patient would benefit from inpatient admission and likely paracentesis.   Final diagnoses:  Other ascites    ED Discharge Orders     None          Torrence Marry GORMAN DEVONNA 03/23/24 1717    Bernard Drivers, MD 03/24/24 1600

## 2024-03-22 NOTE — ED Triage Notes (Signed)
 Abd surgery 2 weeks ago- ERCP with stone found in bile duct. Hx cholecystectomy. XR with PCP today shows swelling/fluid in abdomen. Chills, tremors, swelling in abd/legs.

## 2024-03-22 NOTE — Telephone Encounter (Signed)
 Received a call from Dr. Elodie at [facility], who is seeing this patient for the first time today. She called to confirm whether the patient needs a KUB. I informed her that we have attempted to contact the patient by both phone and letter with no response.  Dr. Elodie will have the patient come in for a KUB (2-view). She also asked that Dr. Wilhelmenia be made aware that on today's exam the patient has a very distended, firm abdomen with ascites. She plans to recommend that the patient go to the ED for evaluation and possible paracentesis.

## 2024-03-22 NOTE — ED Notes (Addendum)
 UA kit at bedside

## 2024-03-22 NOTE — ED Provider Notes (Incomplete)
 Consulted Hospitalist, Dr. Charlton who is agreeable to admission for ascites.

## 2024-03-23 ENCOUNTER — Encounter (HOSPITAL_BASED_OUTPATIENT_CLINIC_OR_DEPARTMENT_OTHER): Payer: Self-pay | Admitting: Family Medicine

## 2024-03-23 ENCOUNTER — Observation Stay (HOSPITAL_COMMUNITY)

## 2024-03-23 DIAGNOSIS — Z9049 Acquired absence of other specified parts of digestive tract: Secondary | ICD-10-CM | POA: Diagnosis not present

## 2024-03-23 DIAGNOSIS — D649 Anemia, unspecified: Secondary | ICD-10-CM | POA: Diagnosis present

## 2024-03-23 DIAGNOSIS — E441 Mild protein-calorie malnutrition: Secondary | ICD-10-CM | POA: Diagnosis present

## 2024-03-23 DIAGNOSIS — K7581 Nonalcoholic steatohepatitis (NASH): Secondary | ICD-10-CM | POA: Diagnosis present

## 2024-03-23 DIAGNOSIS — Z6831 Body mass index (BMI) 31.0-31.9, adult: Secondary | ICD-10-CM | POA: Diagnosis not present

## 2024-03-23 DIAGNOSIS — R188 Other ascites: Secondary | ICD-10-CM | POA: Diagnosis present

## 2024-03-23 DIAGNOSIS — K7469 Other cirrhosis of liver: Secondary | ICD-10-CM | POA: Diagnosis present

## 2024-03-23 DIAGNOSIS — I10 Essential (primary) hypertension: Secondary | ICD-10-CM | POA: Diagnosis present

## 2024-03-23 DIAGNOSIS — I8510 Secondary esophageal varices without bleeding: Secondary | ICD-10-CM | POA: Diagnosis present

## 2024-03-23 DIAGNOSIS — E7800 Pure hypercholesterolemia, unspecified: Secondary | ICD-10-CM | POA: Diagnosis present

## 2024-03-23 DIAGNOSIS — K219 Gastro-esophageal reflux disease without esophagitis: Secondary | ICD-10-CM | POA: Diagnosis present

## 2024-03-23 DIAGNOSIS — K766 Portal hypertension: Secondary | ICD-10-CM | POA: Diagnosis present

## 2024-03-23 DIAGNOSIS — Z79899 Other long term (current) drug therapy: Secondary | ICD-10-CM | POA: Diagnosis not present

## 2024-03-23 DIAGNOSIS — Z66 Do not resuscitate: Secondary | ICD-10-CM | POA: Diagnosis present

## 2024-03-23 LAB — BASIC METABOLIC PANEL WITH GFR
Anion gap: 8 (ref 5–15)
BUN: 10 mg/dL (ref 8–23)
CO2: 26 mmol/L (ref 22–32)
Calcium: 8.5 mg/dL — ABNORMAL LOW (ref 8.9–10.3)
Chloride: 107 mmol/L (ref 98–111)
Creatinine, Ser: 0.54 mg/dL (ref 0.44–1.00)
GFR, Estimated: >60 mL/min (ref >60)
Glucose, Bld: 88 mg/dL (ref 70–99)
Potassium: 3.9 mmol/L (ref 3.5–5.1)
Sodium: 141 mmol/L (ref 135–145)

## 2024-03-23 LAB — HEPATIC FUNCTION PANEL
ALT: 21 U/L (ref 0–44)
AST: 39 U/L (ref 15–41)
Albumin: 2.8 g/dL — ABNORMAL LOW (ref 3.5–5.0)
Alkaline Phosphatase: 192 U/L — ABNORMAL HIGH (ref 38–126)
Bilirubin, Direct: 0.8 mg/dL — ABNORMAL HIGH (ref 0.0–0.2)
Indirect Bilirubin: 0.3 mg/dL (ref 0.3–0.9)
Total Bilirubin: 1.1 mg/dL (ref 0.0–1.2)
Total Protein: 6.2 g/dL — ABNORMAL LOW (ref 6.5–8.1)

## 2024-03-23 LAB — MAGNESIUM: Magnesium: 1.9 mg/dL (ref 1.7–2.4)

## 2024-03-23 LAB — PROTIME-INR
INR: 1.1 (ref 0.8–1.2)
Prothrombin Time: 15.3 s — ABNORMAL HIGH (ref 11.4–15.2)

## 2024-03-23 LAB — BODY FLUID CELL COUNT WITH DIFFERENTIAL
Eos, Fluid: 0 %
Lymphs, Fluid: 74 %
Monocyte-Macrophage-Serous Fluid: 25 % — ABNORMAL LOW (ref 50–90)
Neutrophil Count, Fluid: 1 % (ref 0–25)
Total Nucleated Cell Count, Fluid: 193 uL (ref 0–1000)

## 2024-03-23 LAB — CBC
HCT: 35 % — ABNORMAL LOW (ref 36.0–46.0)
Hemoglobin: 11.8 g/dL — ABNORMAL LOW (ref 12.0–15.0)
MCH: 31.3 pg (ref 26.0–34.0)
MCHC: 33.7 g/dL (ref 30.0–36.0)
MCV: 92.8 fL (ref 80.0–100.0)
Platelets: 152 K/uL (ref 150–400)
RBC: 3.77 MIL/uL — ABNORMAL LOW (ref 3.87–5.11)
RDW: 13.6 % (ref 11.5–15.5)
WBC: 5.3 K/uL (ref 4.0–10.5)
nRBC: 0 % (ref 0.0–0.2)

## 2024-03-23 LAB — ALBUMIN, PLEURAL OR PERITONEAL FLUID: Albumin, Fluid: <1.5 g/dL

## 2024-03-23 LAB — PHOSPHORUS: Phosphorus: 3.1 mg/dL (ref 2.5–4.6)

## 2024-03-23 LAB — PROTEIN, PLEURAL OR PERITONEAL FLUID: Total protein, fluid: <3 g/dL

## 2024-03-23 MED ORDER — ONDANSETRON HCL 4 MG/2ML IJ SOLN: 4.0000 mg | Freq: Four times a day (QID) | INTRAMUSCULAR | Status: DC | PRN

## 2024-03-23 MED ORDER — HYDROXYZINE HCL 25 MG PO TABS
25.0000 mg | ORAL_TABLET | Freq: Once | ORAL | Status: AC
  Administered 2024-03-23: 25 mg via ORAL
  Filled 2024-03-23: qty 1

## 2024-03-23 MED ORDER — ONDANSETRON HCL 4 MG PO TABS: 4.0000 mg | ORAL_TABLET | Freq: Four times a day (QID) | ORAL | Status: DC | PRN

## 2024-03-23 MED ORDER — CARVEDILOL 3.125 MG PO TABS
3.1250 mg | ORAL_TABLET | Freq: Two times a day (BID) | ORAL | Status: DC
  Administered 2024-03-23 – 2024-03-25 (×4): 3.125 mg via ORAL
  Filled 2024-03-23 (×4): qty 1

## 2024-03-23 MED ORDER — FAMOTIDINE 20 MG PO TABS
20.0000 mg | ORAL_TABLET | Freq: Two times a day (BID) | ORAL | Status: DC
  Administered 2024-03-23 – 2024-03-25 (×3): 20 mg via ORAL
  Filled 2024-03-23 (×5): qty 1

## 2024-03-23 MED ORDER — LIDOCAINE-EPINEPHRINE (PF) 2 %-1:200000 IJ SOLN
INTRAMUSCULAR | Status: AC
  Filled 2024-03-23: qty 20

## 2024-03-23 MED ORDER — HYDROMORPHONE HCL 1 MG/ML IJ SOLN
0.5000 mg | Freq: Once | INTRAMUSCULAR | Status: AC
  Administered 2024-03-23: 0.5 mg via INTRAVENOUS
  Filled 2024-03-23: qty 1

## 2024-03-23 MED ORDER — ALBUMIN HUMAN 25 % IV SOLN
12.5000 g | Freq: Once | INTRAVENOUS | Status: AC
  Administered 2024-03-23: 12.5 g via INTRAVENOUS
  Filled 2024-03-23: qty 50

## 2024-03-23 NOTE — Progress Notes (Signed)
 Patient currently in the hospital and being seen for these findings.  JLL

## 2024-03-23 NOTE — H&P (Signed)
 History and Physical    Patient: Robin Hayes FMW:[id number] DOB: 1940/05/22 DOA: 03/22/2024 DOS: the patient was seen and examined on 03/23/2024 PCP: Patient, No Pcp Per  Patient coming from: Home  Chief Complaint:  Chief Complaint  Patient presents with   Post-op Problem   HPI: Robin Hayes is a 83 y.o. female with medical history significant of heartburn, migraine headaches, hyperlipidemia, hypertension, pancreatitis, slow transit constipation, history of cholecystectomy who underwent ERCP with sphincterectomy due to choledocholithiasis on 02/28/2024 who presented to the emergency department with abdominal pain and distention.  Per patient and patient's son, after she got the procedure at she was distended.  She was told that this was because of the area doing Flaig during the procedure and that this would slowly decrease.  However, she has been having persistent abdominal distention and DSS gotten worse over the last few days.  Mild nausea, decreased appetite, but no emesis, diarrhea, constipation, melena or hematochezia.  She denied fever, chills, rhinorrhea, sore throat, wheezing or hemoptysis.  No chest pain, palpitations, diaphoresis, PND, orthopnea or pitting edema of the lower extremities.  No flank pain, dysuria, frequency or hematuria.  No polyuria, polydipsia, polyphagia or blurred vision.   Lab work: Urinalysis shows specific gravity greater than 1.046 and trace protein.  CBC, lipase and lactic acid were normal.  CMP showed normal renal function and electrolytes after calcium correction, glucose 112 and total bilirubin 1.4 mg/dL.  Total protein 7.0 and albumin 3.0 g/dL, AST 57, ALT 21 alkaline phosphatase 232 units/L.  Imaging: CT abdomen/pelvis with contrast showing hazy mild to moderate severity bibasilar atelectasis and/or infiltrate.  Large amount of abdominopelvic ascites.  Small hiatal hernia.  Status postcholecystectomy with air noted throughout the CBD.  Small amount of  air within the lumen of a poorly distended urinary bladder.  Mesenteric stranding within the inferior aspect of the mid and upper abdomen likely inflammatory, but a neoplastic process involving the omentum cannot be excluded.  Aortic atherosclerosis.   ED course: Initial vital signs were temperature 98.2 F, pulse 92, respiration 18, BP 154/70 mmHg and O2 sat 94% on room air.  The patient received hydromorphone 0.5 mg IVP and hydroxyzine 25 mg IVP.  [facility] was consulted.  Review of Systems: As mentioned in the history of present illness. All other systems reviewed and are negative. Past Medical History:  Diagnosis Date   Heart burn    Hx of migraines    Hypercholesteremia    Hypertension    Pancreatitis    Slow transit constipation    Past Surgical History:  Procedure Laterality Date   CESAREAN SECTION     CHOLECYSTECTOMY     ERCP N/A 02/29/2024   Procedure: ERCP, WITH INTERVENTION IF INDICATED;  Surgeon: Wilhelmenia Aloha Raddle., MD;  Location: [facility];  Service: Gastroenterology;  Laterality: N/A;   Social History:  reports that she has never smoked. She has never used smokeless tobacco. She reports that she does not drink alcohol and does not use drugs.  No Known Allergies  History reviewed. No pertinent family history.  Prior to Admission medications   Medication Sig Start Date End Date Taking? Authorizing Provider  carvedilol (COREG) 3.125 MG tablet Take 1 tablet (3.125 mg total) by mouth 2 (two) times daily with a meal. 03/01/24 03/31/24  Raenelle Coria, MD  famotidine (PEPCID) 20 MG tablet Take 20 mg by mouth 2 (two) times daily. 02/04/24   [provider]  magnesium gluconate (MAGONATE) 500 MG tablet Take 500 mg  by mouth daily.    [provider]  Omega-3 Fatty Acids (FISH OIL) 1000 MG CAPS Take 1,000 mg by mouth daily.    [provider]  polyethylene glycol powder (GLYCOLAX/MIRALAX) 17 GM/SCOOP powder Take 17 g by mouth daily.    [provider]  rosuvastatin (CRESTOR) 20 MG tablet Take 20 mg by mouth daily. 01/22/24 07/20/24  [provider]  Vitamin D-Vitamin K (VITAMIN K2-VITAMIN D3 PO) Take by mouth daily.    [provider]    Physical Exam: Vitals:   03/23/24 0615 03/23/24 0730 03/23/24 0742 03/23/24 0743  BP: (!) 133/54 (!) 151/63    Pulse: 74 84  92  Resp:  17    Temp:   98.1 F (36.7 C)   TempSrc:   Oral   SpO2: 94% 94%  92%   Physical Exam Vitals reviewed.  Constitutional:      General: She is awake. She is not in acute distress.    Appearance: She is ill-appearing.  HENT:     Head: Normocephalic and atraumatic.     Nose: No rhinorrhea.     Mouth/Throat:     Mouth: Mucous membranes are moist.  Eyes:     Pupils: Pupils are equal, round, and reactive to light.  Neck:     Vascular: No JVD.  Cardiovascular:     Rate and Rhythm: Normal rate and regular rhythm.     Heart sounds: S1 normal and S2 normal.  Pulmonary:     Effort: Pulmonary effort is normal.     Breath sounds: Normal breath sounds. No wheezing, rhonchi or rales.  Abdominal:     General: Abdomen is protuberant. Bowel sounds are normal. There is distension.     Palpations: Abdomen is soft. There is fluid wave.     Tenderness: There is generalized abdominal tenderness. There is no right CVA tenderness, left CVA tenderness, guarding or rebound.     Comments: Abdominal distention due to ascites.  Musculoskeletal:     Cervical back: Neck supple.     Right lower leg: No edema.     Left lower leg: No edema.  Skin:    General: Skin is warm and dry.  Neurological:     General: No focal deficit present.     Mental Status: She is alert and oriented to person, place, and time.  Psychiatric:        Behavior: Behavior is cooperative.     Data Reviewed:  Results are pending, will review when available.  Assessment and Plan: Principal Problem:   Abdominal ascites Associated with:   Elevated LFTs Superimposed on:    Other cirrhosis of liver (HCC) Recent cholelithiasis/sphincterotomy. Observation/MedSurg. Continue IV fluids. Keep n.p.o. for now. Analgesics as needed. Antiemetics as needed. Follow CBC, CMP in AM. [facility] to evaluate.  Active Problems:   HTN (hypertension) Continue carvedilol 3.125 mg p.o. twice daily.    Dyslipidemia Hold rosuvastatin pending LFTs.    Mild protein malnutrition In the setting of anemia and acute illness. May benefit from protein supplementation. Consider nutritional services evaluation. Follow-up albumin level.    Normocytic anemia Monitor hematocrit hemoglobin. Transfuse as needed.    Advance Care Planning:   Code Status: Do not attempt resuscitation (DNR) PRE-ARREST INTERVENTIONS DESIRED   Consults:   Family Communication: Her son was by bedside.  Severity of Illness: The appropriate patient status for this patient is OBSERVATION. Observation status is judged to be reasonable and necessary in order to provide the  required intensity of service to ensure the patient's safety. The patient's presenting symptoms, physical exam findings, and initial radiographic and laboratory data in the context of their medical condition is felt to place them at decreased risk for further clinical deterioration. Furthermore, it is anticipated that the patient will be medically stable for discharge from the hospital within 2 midnights of admission.   Author: Alm Dorn Castor, MD 03/23/2024 8:34 AM  For on call review [email or web address].   This document was prepared using Dragon voice recognition software and may contain some unintended transcription errors.

## 2024-03-23 NOTE — ED Notes (Addendum)
 Pt placed on 2L [place] for comfort after pain med administered.v O2 sats dropped to 89%. Recovery to 94% after o2. RT ashley notified

## 2024-03-23 NOTE — Consult Note (Addendum)
 Referring Provider: Dr. Alm Castor Primary Care Physician:  Patient, No Pcp Per Primary Gastroenterologist: Dr. Wilhelmenia  Reason for Consultation: Abdominal pain, ascites, s/p ERCP 2   HPI: Robin Hayes is a 83 y.o. 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  She was initially evaluated in her outpatient GI clinic by Delon Failing, PA-C 02/23/2024 for further evaluation regarding RUQ pain and elevated LFTs.  Labs during that visit showed a total bilirubin level of 4.8, alk phos 893.  AST 195.  ALT 76.  LFTs were elevated since 07/2023.  ANA positive.  AMA < 20.  SMA < 20. INR 1.3.  A1AT 267.  Ceruloplasmin 37.  Celiac serology was negative.  Hepatitis A total antibody reactive.    Prior labs 07/14/2023 were reviewed: Alk phos 579, AST 120, ALT 128.  T. bili normal at 0.3.  Hep B surface antigen, core total antibody and surface antibody all nonreactive/negative.  Hep C antibody nonreactive.   She underwent an abdominal MRI/MRCP with and without contrast 02/24/2024 which identified choledocholithiasis at the ampulla, severe intra/extrahepatic biliary ductal dilatation, CBD measuring up to 1.6 cm, postcholecystectomy physiology, moderate volume of ascites and mild splenomegaly.  There was no mention of nodular liver contour/cirrhosis or hepatoma.  She subsequently underwent an EGD/ERCP by Dr. Wilhelmenia 02/29/2024 which identified grade 1, 2 and 3 esophageal varices in the middle/distal esophagus, portal hypertensive gastropathy.  The major papilla appeared congested, status post biliary fistulotomy and 1 temporary plastic pancreatic stent was placed in the ventral pancreatic duct, a filling defect was consistent with a  stone and sludge on cholangiogram, choledocholithiasis was found and was successfully removed.  Carvedilol 6.25 mg once to twice daily was recommended.  Esophageal banding was deferred in setting of large fistulotomy/sphincterotomy, if she had bleeding, having bands in place could have made things more difficult if bleeding occurred.  Labs 03/01/2024: Total bili 1.9.  Alk phos 625.  AST 92.  ALT 48.  She presented to [facility] 03/22/2024 with abdominal pain and swelling which has progressively worsened over the past 2 weeks s/p ERCP 02/29/2024 as noted above.  Labs in the ED showed a WBC count of 6.7.  Hemoglobin 13.3.  Hematocrit 39.9.  Platelets 195.  Sodium 137.  Potassium 4.8.  BUN 11.  Creatinine 0.61.  Total bili 1.4.  Alk phos 232.  AST 57.  ALT 21.  Lipase 13.  Lactic acid 1.8.  Negative urinalysis.  CTAP with contrast showed no liver abnormality, no focal liver lesion, air noted throughout the CBD, post cholecystectomy physiology, a normal spleen moderately large amount of abdominal/pelvic ascites and mesenteric stranding within the anterior aspect of the mid/upper abdomen query sequela of neoplastic process involving the omentum.  I called [facility] radiology to inquire further regarding liver findings per CTAP as cirrhosis was not documented, need to ascertain if patient has non-cirrhotic portal hypertension verses suspected cirrhosis with portal hypertension.   I spoke with radiologist, Dr. Terri, who reviewed abdominal MRI/MRCP 02/24/2024 and CTAP with contrast 03/12/2024 results and her review assessed a nodular liver consistent with cirrhosis. Dr. Zare will facilitate an addendum to this report.   She has been Spanish, her son is at the bedside who speaks English fluently and he interpreted to facilitate appropriate.  Patient at this time.  Her granddaughter is at the  bedside as well.  She endorses having abdominal swelling and bloating which progressively worsened over the past 2 weeks.  She also noted having swelling to her legs as assessed by her PCP.  Not on diuretics.  She denies having any significant abdominal pain.  No constipation.  Last BM was yesterday.  No bloody or black stools.  Non-smoker.  No alcohol use.  No drug use.  No known family history of liver disease.  Never had a screening colonoscopy.  Her son stated patient has been relatively healthy until she turned 38.  LFTs have been elevated since at least 07/2023.  GI PROCEDURES:  ERCP 02/29/2024: - No gross lesions in the proximal esophagus. Grade I, grade II and grade III esophageal varices found in the middle/distal esophagus.  - Z-line irregular, 38 cm from the incisors.  - Portal hypertensive gastropathy.  - Erythematous mucosa in the stomach. Biopsied.  - No gross lesions in the duodenal bulb, in the first portion of the duodenum and in the second portion of the duodenum.  - The major papilla appeared congested.  - Difficult cannulation eventually requiring biliary fistulotomy precut to access the biliary tree as documented above.  - One temporary plastic pancreatic stent was placed into the ventral pancreatic duct.  - After biliary cannulation was obtained, a filling defect consistent with a stone and sludge was seen on the cholangiogram.  - The entire biliary tree was severely dilated.  - Choledocholithiasis was found. Complete removal was accomplished by biliary sphincterotomy/fistulotomy and balloon sweeping  A. GASTRIC BIOPSY:  - Chronic inactive gastritis with intestinal metaplasia  - Negative for H. pylori on HE stain  - Negative for dysplasia or malignancy   PRIOR IMAGE STUDIES:  Abdominal MRI with and without contrast 02/24/2024: FINDINGS: Lower chest: No acute abnormality.   Hepatobiliary: No solid liver abnormality is seen. Cholecystectomy. Severe intra and extrahepatic biliary ductal dilatation, the common bile duct measuring up to 1.6 cm in caliber. Choledocholithiasis, 0.7 cm  calculus at the ampulla (series 3, image 16).   Pancreas: Unremarkable. No pancreatic ductal dilatation or surrounding inflammatory changes.   Spleen: Mild splenomegaly, maximum coronal span 13.5 cm   Adrenals/Urinary Tract: Adrenal glands are unremarkable. Kidneys are normal, without renal calculi, solid lesion, or hydronephrosis.   Stomach/Bowel: Stomach is within normal limits. No evidence of bowel wall thickening, distention, or inflammatory changes.   Vascular/Lymphatic: Aortic atherosclerosis. No enlarged abdominal lymph nodes.   Other: No abdominal wall hernia or abnormality. Moderate volume ascites throughout the abdomen and pelvis.   Musculoskeletal: No acute or significant osseous findings.   IMPRESSION: 1. Choledocholithiasis, 0.7 cm calculus at the ampulla. 2. Severe intra and extrahepatic biliary ductal dilatation, the common bile duct measuring up to 1.6 cm in caliber. 3. Cholecystectomy. 4. Moderate volume ascites throughout the abdomen and pelvis. 5. Mild splenomegaly.  Aortic Atherosclerosis   Past Medical History:  Diagnosis Date   Heart burn    Hx of migraines    Hypercholesteremia    Hypertension    Pancreatitis    Slow transit constipation     Past Surgical History:  Procedure Laterality Date   CESAREAN SECTION     CHOLECYSTECTOMY     ERCP N/A 02/29/2024   Procedure: ERCP, WITH INTERVENTION IF INDICATED;  Surgeon: Wilhelmenia Aloha Raddle., MD;  Location: [facility];  Service: Gastroenterology;  Laterality: N/A;    Prior to Admission medications   Medication Sig Start Date End Date Taking? Authorizing Provider  carvedilol (COREG) 3.125 MG  tablet Take 1 tablet (3.125 mg total) by mouth 2 (two) times daily with a meal. 03/01/24 03/31/24  Raenelle Coria, MD  famotidine (PEPCID) 20 MG tablet Take 20 mg by mouth 2 (two) times daily. 02/04/24   [provider]  magnesium gluconate (MAGONATE) 500 MG tablet Take 500 mg by mouth daily.     [provider]  Omega-3 Fatty Acids (FISH OIL) 1000 MG CAPS Take 1,000 mg by mouth daily.    [provider]  polyethylene glycol powder (GLYCOLAX/MIRALAX) 17 GM/SCOOP powder Take 17 g by mouth daily.    [provider]  rosuvastatin (CRESTOR) 20 MG tablet Take 20 mg by mouth daily. 01/22/24 07/20/24  [provider]  Vitamin D-Vitamin K (VITAMIN K2-VITAMIN D3 PO) Take by mouth daily.    [provider]    Current Facility-Administered Medications  Medication Dose Route Frequency Provider Last Rate Last Admin   ondansetron (ZOFRAN) tablet 4 mg  4 mg Oral Q6H PRN Celinda Alm Lot, MD       Or   ondansetron (ZOFRAN) injection 4 mg  4 mg Intravenous Q6H PRN Celinda Alm Lot, MD        Allergies as of 03/22/2024   (No Known Allergies)    History reviewed. No pertinent family history.  Social History   Socioeconomic History   Marital status: Widowed    Spouse name: Not on file   Number of children: 5   Years of education: Not on file   Highest education level: Not on file  Occupational History   Not on file  Tobacco Use   Smoking status: Never   Smokeless tobacco: Never  Vaping Use   Vaping status: Never Used  Substance and Sexual Activity   Alcohol use: No   Drug use: No   Sexual activity: Not on file  Other Topics Concern   Not on file  Social History Narrative   Not on file   Social Drivers of Health   Financial Resource Strain: Low Risk  (07/14/2023)   Received from [facility]   Overall Financial Resource Strain (CARDIA)    Difficulty of Paying Living Expenses: Not very hard  Food Insecurity: No Food Insecurity (02/28/2024)   Hunger Vital Sign    Worried About Running Out of Food in the Last Year: Never true    Ran Out of Food in the Last Year: Never true  Transportation Needs: No Transportation Needs (02/28/2024)   PRAPARE - Administrator, Civil Service (Medical): No    Lack of Transportation  (Non-Medical): No  Physical Activity: Sufficiently Active (07/14/2023)   Received from [facility]   Exercise Vital Sign    On average, how many days per week do you engage in moderate to strenuous exercise (like a brisk walk)?: 7 days    On average, how many minutes do you engage in exercise at this level?: 30 min  Stress: No Stress Concern Present (07/14/2023)   Received from [facility] - Occupational Stress Questionnaire    Feeling of Stress : Not at all  Social Connections: Moderately Integrated (02/28/2024)   Social Connection and Isolation Panel    Frequency of Communication with Friends and Family: More than three times a week    Frequency of Social Gatherings with Friends and Family: More than three times a week    Attends Religious Services: More than 4 times per year    Active Member of [employer]  or Organizations: Yes    Attends Banker Meetings: More than 4 times per year    Marital Status: Widowed  Intimate Partner Violence: Not At Risk (02/28/2024)   Humiliation, Afraid, Rape, and Kick questionnaire    Fear of Current or Ex-Partner: No    Emotionally Abused: No    Physically Abused: No    Sexually Abused: No   Review of Systems: Gen: Denies fever, sweats or chills. No weight loss.  CV: Denies chest pain, palpitations or edema. Resp: Denies cough, shortness of breath of hemoptysis.  GI: See HPI. GU : Denies urinary burning, blood in urine, increased urinary frequency or incontinence. MS: Denies joint pain, muscles aches or weakness. Derm: Denies rash, itchiness, skin lesions or unhealing ulcers. Psych: Denies depression, anxiety, memory loss or confusion. Heme: Denies easy bruising, bleeding. Neuro:  Denies headaches, dizziness or paresthesias. Endo:  Denies any problems with DM, thyroid or adrenal function.  Physical Exam: Vital signs in last 24 hours: Temp:  [98.1 F (36.7 C)-98.4 F (36.9 C)] 98.1 F (36.7  C) (11/18 0742) Pulse Rate:  [66-95] 92 (11/18 0743) Resp:  [15-18] 17 (11/18 0730) BP: (102-170)/(53-85) 151/63 (11/18 0730) SpO2:  [90 %-97 %] 92 % (11/18 0743) Last BM Date : 03/21/24 General: Alert 83 year old female in no acute distress. Head:  Normocephalic and atraumatic. Eyes:  No scleral icterus. Conjunctiva pink. Ears:  Normal auditory acuity. Nose:  No deformity, discharge or lesions. Mouth: Poor dentition no ulcers or lesions.  Neck:  Supple. No lymphadenopathy or thyromegaly.  Lungs: Breath sounds clear throughout. No wheezes, rhonchi or crackles.  Heart: Regular rate and rhythm, no murmurs. Abdomen: Abdomen is distended, upper abdomen protrudes.  Moderate ascites, abdomen is not tense.  No palpable mass.  Positive bowel sounds to all 4 quadrants. Rectal: Deferred. Musculoskeletal:  Symmetrical without gross deformities.  Pulses:  Normal pulses noted. Extremities:  Without clubbing or edema. Neurologic:  Alert and oriented x 3.  Speech is clear.  Moves all extremities equally.  No asterixis. Skin:  Intact without significant lesions or rashes. Psych:  Alert and cooperative. Normal mood and affect.  Intake/Output from previous day: No intake/output data recorded. Intake/Output this shift: No intake/output data recorded.  Lab Results: Recent Labs    03/22/24 1802  WBC 6.7  HGB 13.3  HCT 39.9  PLT 195   BMET Recent Labs    03/22/24 1910  NA 137  K 4.8  CL 104  CO2 24  GLUCOSE 112*  BUN 11  CREATININE 0.61  CALCIUM 8.8*   LFT Recent Labs    03/22/24 1910  PROT 7.0  ALBUMIN 3.0*  AST 57*  ALT 21  ALKPHOS 232*  BILITOT 1.4*   PT/INR No results for input(s): LABPROT, INR in the last 72 hours. Hepatitis Panel No results for input(s): HEPBSAG, HCVAB, HEPAIGM, HEPBIGM in the last 72 hours.  MELD 3.0: 10 at 03/23/2024  9:10 AM MELD-Na: 8 at 03/23/2024  9:10 AM Calculated from: Serum Creatinine: 0.54 mg/dL (Using min of 1 mg/dL) at  88/81/7974  0:89 AM Serum Sodium: 141 mmol/L (Using max of 137 mmol/L) at 03/23/2024  9:10 AM Total Bilirubin: 1.1 mg/dL at 88/81/7974  0:89 AM Serum Albumin: 2.8 g/dL at 88/81/7974  0:89 AM INR(ratio): 1.1 at 03/23/2024  9:10 AM Age at listing (hypothetical): 57 years Sex: Female at 03/23/2024  9:10 AM   Studies/Results: CT ABDOMEN PELVIS W CONTRAST Result Date: 03/22/2024 CLINICAL DATA:  History of cholecystectomy and recent ERCP  with stone found in bile duct, presenting with chills, tremor and abdominal and leg swelling. EXAM: CT ABDOMEN AND PELVIS WITH CONTRAST TECHNIQUE: Multidetector CT imaging of the abdomen and pelvis was performed using the standard protocol following bolus administration of intravenous contrast. RADIATION DOSE REDUCTION: This exam was performed according to the departmental dose-optimization program which includes automated exposure control, adjustment of the mA and/or kV according to patient size and/or use of iterative reconstruction technique. CONTRAST:  85mL OMNIPAQUE IOHEXOL 300 MG/ML  SOLN COMPARISON:  None Available. FINDINGS: Lower chest: Hazy, mild to moderate severity areas of atelectasis and/or infiltrate are seen within the bilateral lung bases. Hepatobiliary: No focal liver abnormality is seen. Status post cholecystectomy. Air is noted throughout the common bile duct. Pancreas: Unremarkable. No pancreatic ductal dilatation or surrounding inflammatory changes. Spleen: Normal in size without focal abnormality. Adrenals/Urinary Tract: Adrenal glands are unremarkable. Kidneys are normal, without renal calculi, focal lesion, or hydronephrosis. A small amount of air is seen within the lumen of a poorly distended urinary bladder. Stomach/Bowel: There is a small hiatal hernia. The appendix is not clearly identified. No evidence of bowel wall thickening or bowel distention. Mild, hazy mesenteric inflammatory stranding is seen within the anterior aspect of the upper abdomen.  Vascular/Lymphatic: Aortic atherosclerosis. No enlarged abdominal or pelvic lymph nodes. Reproductive: Uterus and bilateral adnexa are unremarkable. Other: There is mild anterior and lateral abdominal and pelvic wall anasarca. A large amount of abdominopelvic ascites is seen. Musculoskeletal: Multilevel degenerative changes are noted within the lumbar spine, most prominent at the levels of L2-L3 and L3-L4. IMPRESSION: 1. Hazy, mild to moderate severity bibasilar atelectasis and/or infiltrate. 2. Large amount of abdominopelvic ascites. 3. Small hiatal hernia. 4. Status post cholecystectomy with air noted throughout the common bile duct. 5. Small amount of air within the lumen of a poorly distended urinary bladder. While this may represent sequelae associated with recent bladder instrumentation, correlation with urinalysis is recommended to exclude the presence of cystitis. 6. Mesenteric stranding within the anterior aspect of the mid and upper abdomen. While this may be inflammatory in origin, sequelae associated with a neoplastic process involving the omentum cannot be excluded. 7. Aortic atherosclerosis. Electronically Signed   By: Suzen Dials M.D.   On: 03/22/2024 21:16    IMPRESSION/PLAN:  83 year old female newly diagnosed with cirrhosis with portal hypertension, esophageal varices, ascites and splenomegaly.  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 MELD 3.0: 10. Elevated LFTs since at least 07/2023.  Admitted with worsening abdominal distention/ascites status post ERCP 02/29/2024. CTAP with contrast 11/17 showed a nodular liver contour consistent with cirrhosis per second read by radiologist Dr. Terri, air noted throughout the CBD, post cholecystectomy physiology, a normal spleen moderately large amount of abdominal/pelvic ascites and mesenteric stranding within the anterior aspect of the mid/upper abdomen query sequela of neoplastic process involving the omentum.  - Diagnostic paracentesis, peritoneal fluid labs to  include cell count with differential, Gram stain, albumin, protein, aerobic/anaerobic culture and cytology. Max fluid to be removed 4L. - Albumin 25 g IV post paracentesis - Defer diuretic recommendations to Dr. Stacia - 2 g low-sodium diet - Continue carvedilol 3.125 mg p.o. twice daily  Prior RUQ pain with elevated LFTs. MRI/MRCP with and without contrast 02/24/2024 which identified choledocholithiasis at the ampulla, severe intra/extrahepatic biliary ductal dilatation, CBD measuring up to 1.6 cm, postcholecystectomy physiology, moderate volume of ascites and mild splenomegaly. EGD/ERCP by Dr. Wilhelmenia 02/29/2024 which identified grade 1, 2 and 3 esophageal varices in the middle/distal esophagus, portal hypertensive gastropathy.  The major papilla appeared congested, status post biliary fistulotomy and 1 temporary plastic pancreatic stent was placed in the ventral pancreatic duct, a filling defect was consistent with a stone and sludge on cholangiogram, choledocholithiasis was found and was successfully removed.  Total bili 1.1.  Alk phos 192.  AST 39.  ALT 21. - Continue to trend LFTs  Mild normocytic anemia.  Hemoglobin 13.3 -> Hg 11.8.  No overt GI bleeding.  History of hypertension and hyperlipidemia  GERD - Continue Famotidine 20 mg twice daily  Elida CHRISTELLA Shawl  03/23/2024, 11:48 AM  ---------------------------------------------------------------------------------------------------------------------------  I have taken a history, reviewed the chart and examined the patient. I performed a substantive portion of this encounter, including complete performance of at least one of the key components, in conjunction with the APP. I agree with the APP's note, impression and recommendations  83 year old female with history of hypertension, hyperlipidemia and recent admission for symptomatic choledocholithiasis status post ERCP admitted with new ascites.  As outlined above, she was  noted to have elevated liver enzymes in a mixed pattern earlier in the year.  Workup for causes of chronic liver disease was unremarkable.  MRCP last month demonstrated presence of ascites, and EGD/ERCP noted the presence of large esophageal varices.  She underwent a therapeutic/diagnostic paracentesis earlier today.  Fluid analysis was most likely consistent with high SAAG (exact SAAG value unable to be calculated due to undetermined ascitic fluid albumin value) consistent with ascites from portal hypertension.  Fluid analysis negative for SBP. Low suspicion for other sources of ascites and other than cirrhosis.  Cytology results pending.  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  Even though she does not have diabetes, she does have other risk factors to include hypertension, hyperlipidemia and elevated BMI.   She admits to eating a diet high in carbohydrates and sweets.  I had a long discussion with the patient and her son regarding cirrhosis, ascites and MASH. We discussed the importance of a low-salt diet and diuretics to limit reaccumulation of ascites.  We discussed the importance of diet, mainly reduction in consumption of sugary foods/carbohydrates and limiting further damage via fatty liver.  We discussed other decompensations of cirrhosis to include bleeding varices and hepatic encephalopathy.  Recommend patient be discharged on low-dose diuretics (Lasix 20 mg, Aldactone 50 mg) as well as the carvedilol for primary variceal bleeding prophylaxis We will arrange outpatient follow-up to monitor renal function and adjust diuretics accordingly.   Laymon Stockert E. Stacia, MD [facility]   I spent a total of 45 minutes reviewing the patient's medical record, interviewing and examining the patient, discussing her diagnosis and management of her condition going forward, and documenting in the medical record

## 2024-03-23 NOTE — ED Notes (Signed)
 Ambulated to the br. Tolerated well

## 2024-03-23 NOTE — Progress Notes (Signed)
 Plan of Care Note for accepted transfer   Patient: Robin Hayes MRN: [id number]   DOA: 03/22/2024  Facility requesting transfer: [facility]   Requesting Provider: Dr. Roselyn   Reason for transfer: Abdominal ascites   Facility course: 83 yr old female with HTN, cholecystectomy, and admission last month for choledocholithiasis s/p ERCP with sphincterotomy who was found at that time to have portal hypertension and gastropathy, now presenting with abdominal pain and distension.   CT notable for large volume of ascites.   GI (Dr. Shila) was consulted by the ED and recommended medical admission.   Plan of care: The patient is accepted for admission to Med-surg  unit, at [facility].   Author: Evalene GORMAN Sprinkles, MD 03/23/2024  Check [email or web address] for on-call coverage.  Nursing staff, Please call [facility] Admits & Consults System-Wide number on Amion as soon as patient's arrival, so appropriate admitting provider can evaluate the pt.

## 2024-03-23 NOTE — Plan of Care (Signed)

## 2024-03-23 NOTE — Procedures (Signed)
 PROCEDURE SUMMARY:  Successful ultrasound guided diagnostic and therapeutic paracentesis from the left lower quadrant.  Yielded 3.7 liters of slightly hazy, yellow fluid.  No immediate complications.  The patient tolerated the procedure well.   Specimen was sent for labs.  EBL < 2 cc.   If the patient eventually requires >/=2 paracenteses in a 30 day period, screening evaluation by the [facility]pert[facility] will be assessed.   Kevin Macario Shear,PA-C

## 2024-03-24 DIAGNOSIS — Z66 Do not resuscitate: Secondary | ICD-10-CM | POA: Insufficient documentation

## 2024-03-24 DIAGNOSIS — K7581 Nonalcoholic steatohepatitis (NASH): Secondary | ICD-10-CM | POA: Diagnosis not present

## 2024-03-24 DIAGNOSIS — R188 Other ascites: Secondary | ICD-10-CM | POA: Diagnosis not present

## 2024-03-24 LAB — COMPREHENSIVE METABOLIC PANEL WITH GFR
ALT: 15 U/L (ref 0–44)
AST: 36 U/L (ref 15–41)
Albumin: 2.5 g/dL — ABNORMAL LOW (ref 3.5–5.0)
Alkaline Phosphatase: 160 U/L — ABNORMAL HIGH (ref 38–126)
Anion gap: 6 (ref 5–15)
BUN: 11 mg/dL (ref 8–23)
CO2: 26 mmol/L (ref 22–32)
Calcium: 8 mg/dL — ABNORMAL LOW (ref 8.9–10.3)
Chloride: 107 mmol/L (ref 98–111)
Creatinine, Ser: 0.58 mg/dL (ref 0.44–1.00)
GFR, Estimated: >60 mL/min (ref >60)
Glucose, Bld: 76 mg/dL (ref 70–99)
Potassium: 4.2 mmol/L (ref 3.5–5.1)
Sodium: 139 mmol/L (ref 135–145)
Total Bilirubin: 1 mg/dL (ref 0.0–1.2)
Total Protein: 5.1 g/dL — ABNORMAL LOW (ref 6.5–8.1)

## 2024-03-24 LAB — CBC
HCT: 31.4 % — ABNORMAL LOW (ref 36.0–46.0)
Hemoglobin: 10.3 g/dL — ABNORMAL LOW (ref 12.0–15.0)
MCH: 30.9 pg (ref 26.0–34.0)
MCHC: 32.8 g/dL (ref 30.0–36.0)
MCV: 94.3 fL (ref 80.0–100.0)
Platelets: 116 K/uL — ABNORMAL LOW (ref 150–400)
RBC: 3.33 MIL/uL — ABNORMAL LOW (ref 3.87–5.11)
RDW: 13.5 % (ref 11.5–15.5)
WBC: 4.3 K/uL (ref 4.0–10.5)
nRBC: 0 % (ref 0.0–0.2)

## 2024-03-24 MED ORDER — FUROSEMIDE 20 MG PO TABS
20.0000 mg | ORAL_TABLET | Freq: Every day | ORAL | Status: DC
  Administered 2024-03-24 – 2024-03-25 (×2): 20 mg via ORAL
  Filled 2024-03-24 (×2): qty 1

## 2024-03-24 MED ORDER — SPIRONOLACTONE 25 MG PO TABS
50.0000 mg | ORAL_TABLET | Freq: Every day | ORAL | Status: DC
  Administered 2024-03-24 – 2024-03-25 (×2): 50 mg via ORAL
  Filled 2024-03-24 (×2): qty 2

## 2024-03-24 NOTE — Assessment & Plan Note (Addendum)
 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 twice daily Likely home 11/20 if BP and renal function are stable Recommend patient be discharged on low-dose diuretics (Lasix 20 mg, Aldactone 50 mg) as well as the carvedilol for primary variceal bleeding prophylaxis Outpatient GI f/u recommended

## 2024-03-24 NOTE — Telephone Encounter (Signed)
 Reviewed. Patient admitted to the [facility] hospital. CT has been completed. PD stent is no longer present. Can remove need for KUB and list for follow up. Thanks. GM

## 2024-03-24 NOTE — Assessment & Plan Note (Addendum)
 In the setting of anemia and acute illness May benefit from protein supplementation

## 2024-03-24 NOTE — Assessment & Plan Note (Signed)
 Monitor Transfuse if needed

## 2024-03-24 NOTE — Assessment & Plan Note (Signed)
-   Continue carvedilol

## 2024-03-24 NOTE — Hospital Course (Signed)
 83yo with h/o HTN, HLD, pancreatitis, and choledocholithiasis s/p ERCP and cholecystectomy 10/25 who presented on 11/17 with abdominal pain and distention.  CT with ascites, observed for GI consult and paracentesis.

## 2024-03-24 NOTE — Assessment & Plan Note (Signed)
Hold rosuvastatin. 

## 2024-03-24 NOTE — Telephone Encounter (Signed)
 Noted pt KUB removed

## 2024-03-24 NOTE — Progress Notes (Addendum)
 [facility] Progress Note  CC:  Abdominal pain, ascites, s/p ERCP 2   Subjective: Feels good.  No abdominal pain.  Ate well for breakfast.  Daughter and son were both at bedside.  Had extensive conversation with them.  Objective:  Vital signs in last 24 hours: Temp:  [98.3 F (36.8 C)-98.8 F (37.1 C)] 98.7 F (37.1 C) (11/19 0532) Pulse Rate:  [67-85] 67 (11/19 0532) Resp:  [16] 16 (11/19 0532) BP: (111-161)/(58-73) 114/63 (11/19 0532) SpO2:  [94 %-97 %] 96 % (11/19 0532) Last BM Date : 03/21/24 General:  Alert, Well-developed, in NAD Heart:  Regular rate and rhythm; no murmurs Pulm:  CTAB.  No W/R/R. Abdomen:  Soft.  BS present.  Non-tender. Extremities:  Slight pitting edema in B/L LEs. Neurologic:  Alert and oriented x 4;  grossly normal neurologically. Psych:  Alert and cooperative. Normal mood and affect.  Intake/Output from previous day: 11/18 0701 - 11/19 0700 In: -  Out: 100 [Urine:100]  Lab Results: Recent Labs    03/22/24 1802 03/23/24 0910 03/24/24 0424  WBC 6.7 5.3 4.3  HGB 13.3 11.8* 10.3*  HCT 39.9 35.0* 31.4*  PLT 195 152 116*   BMET Recent Labs    03/22/24 1910 03/23/24 0910 03/24/24 0424  NA 137 141 139  K 4.8 3.9 4.2  CL 104 107 107  CO2 24 26 26   GLUCOSE 112* 88 76  BUN 11 10 11   CREATININE 0.61 0.54 0.58  CALCIUM 8.8* 8.5* 8.0*   LFT Recent Labs    03/23/24 0910 03/24/24 0424  PROT 6.2* 5.1*  ALBUMIN  2.8* 2.5*  AST 39 36  ALT 21 15  ALKPHOS 192* 160*  BILITOT 1.1 1.0  BILIDIR 0.8*  --   IBILI 0.3  --    PT/INR Recent Labs    03/23/24 0910  LABPROT 15.3*  INR 1.1   US  Paracentesis Result Date: 03/23/2024 INDICATION: Patient with history of pancreatitis, cirrhosis, portal hypertension, esophageal varices, ascites, splenomegaly. Request received for diagnostic and therapeutic paracentesis. EXAM: ULTRASOUND GUIDED DIAGNOSTIC AND THERAPEUTIC PARACENTESIS MEDICATIONS: 8 mL 1% lidocaine  with epinephrine   to skin/subcutaneous tissue COMPLICATIONS: None immediate. PROCEDURE: Informed written consent was obtained from the patient via interpreter after a discussion of the risks, benefits and alternatives to treatment. A timeout was performed prior to the initiation of the procedure. Initial ultrasound scanning demonstrates a moderate to large amount of ascites within the left lower abdominal quadrant. The left lower abdomen was prepped and draped in the usual sterile fashion. 1% lidocaine  with epinephrine  was used for local anesthesia. Following this, a 19 gauge, 10-cm, Yueh catheter was introduced. An ultrasound image was saved for documentation purposes. The paracentesis was performed. The catheter was removed and a dressing was applied. The patient tolerated the procedure well without immediate post procedural complication. FINDINGS: A total of approximately 3.7 liters of slightly hazy, yellow fluid was removed. Samples were sent to the laboratory as requested by the clinical team. IMPRESSION: Successful ultrasound-guided diagnostic and therapeutic paracentesis yielding 3.7 liters of peritoneal fluid. PLAN: If the patient eventually requires >/=2 paracenteses in a 30 day period, candidacy for formal evaluation by the [facility] Interventional Radiology [facility] Hypert[facility] will be assessed. Performed by: Franky Rakers, PA-C Electronically Signed   By: Juliene Balder M.D.   On: 03/23/2024 17:55   CT ABDOMEN PELVIS W CONTRAST Result Date: 03/22/2024 CLINICAL DATA:  History of cholecystectomy and recent ERCP with stone found in bile duct, presenting with chills,  tremor and abdominal and leg swelling. EXAM: CT ABDOMEN AND PELVIS WITH CONTRAST TECHNIQUE: Multidetector CT imaging of the abdomen and pelvis was performed using the standard protocol following bolus administration of intravenous contrast. RADIATION DOSE REDUCTION: This exam was performed according to the departmental dose-optimization program which  includes automated exposure control, adjustment of the mA and/or kV according to patient size and/or use of iterative reconstruction technique. CONTRAST:  85mL OMNIPAQUE IOHEXOL 300 MG/ML  SOLN COMPARISON:  None Available. FINDINGS: Lower chest: Hazy, mild to moderate severity areas of atelectasis and/or infiltrate are seen within the bilateral lung bases. Hepatobiliary: No focal liver abnormality is seen. Status post cholecystectomy. Air is noted throughout the common bile duct. Pancreas: Unremarkable. No pancreatic ductal dilatation or surrounding inflammatory changes. Spleen: Normal in size without focal abnormality. Adrenals/Urinary Tract: Adrenal glands are unremarkable. Kidneys are normal, without renal calculi, focal lesion, or hydronephrosis. A small amount of air is seen within the lumen of a poorly distended urinary bladder. Stomach/Bowel: There is a small hiatal hernia. The appendix is not clearly identified. No evidence of bowel wall thickening or bowel distention. Mild, hazy mesenteric inflammatory stranding is seen within the anterior aspect of the upper abdomen. Vascular/Lymphatic: Aortic atherosclerosis. No enlarged abdominal or pelvic lymph nodes. Reproductive: Uterus and bilateral adnexa are unremarkable. Other: There is mild anterior and lateral abdominal and pelvic wall anasarca. A large amount of abdominopelvic ascites is seen. Musculoskeletal: Multilevel degenerative changes are noted within the lumbar spine, most prominent at the levels of L2-L3 and L3-L4. IMPRESSION: 1. Hazy, mild to moderate severity bibasilar atelectasis and/or infiltrate. 2. Large amount of abdominopelvic ascites. 3. Small hiatal hernia. 4. Status post cholecystectomy with air noted throughout the common bile duct. 5. Small amount of air within the lumen of a poorly distended urinary bladder. While this may represent sequelae associated with recent bladder instrumentation, correlation with urinalysis is recommended to  exclude the presence of cystitis. 6. Mesenteric stranding within the anterior aspect of the mid and upper abdomen. While this may be inflammatory in origin, sequelae associated with a neoplastic process involving the omentum cannot be excluded. 7. Aortic atherosclerosis. Electronically Signed   By: Suzen Dials M.D.   On: 03/22/2024 21:16   Assessment / Plan: 83 year old female newly diagnosed with cirrhosis with portal hypertension, esophageal varices, ascites and splenomegaly.  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 MELD 3.0: 10. Elevated LFTs since at least 07/2023.  Admitted with worsening abdominal distention/ascites status post ERCP 02/29/2024. CTAP with contrast 11/17 showed a nodular liver contour consistent with cirrhosis per second read by radiologist Dr. Terri, air noted throughout the CBD, post cholecystectomy physiology, a normal spleen moderately large amount of abdominal/pelvic ascites and mesenteric stranding within the anterior aspect of the mid/upper abdomen query sequela of neoplastic process involving the omentum.  - Paracentesis with 3.7 Liters removed on 11/18, negative for SBP.  SAAG most likely c/w cirrhosis/portal HTN.  Cytology pending,. - Start Lasix 20 mg daily and spironolactone 50 mg daily today. - 2 g low-sodium diet - Continue carvedilol 3.125 mg p.o. twice daily   Prior RUQ pain with elevated LFTs. MRI/MRCP with and without contrast 02/24/2024 which identified choledocholithiasis at the ampulla, severe intra/extrahepatic biliary ductal dilatation, CBD measuring up to 1.6 cm, postcholecystectomy physiology, moderate volume of ascites and mild splenomegaly. EGD/ERCP by Dr. Wilhelmenia 02/29/2024 which identified grade 1, 2 and 3 esophageal varices in the middle/distal esophagus, portal hypertensive gastropathy.  The major papilla appeared congested, status post biliary fistulotomy and 1 temporary plastic pancreatic  stent was placed in the ventral pancreatic duct, a filling defect was  consistent with a stone and sludge on cholangiogram, choledocholithiasis was found and was successfully removed.  LFTs ok with a down-trending ALP at 160 today. - Continue to trend LFTs   Mild normocytic anemia.  Hemoglobin 13.3 -> Hg 11.8->10.3 grams.  No overt GI bleeding.   History of hypertension and hyperlipidemia   GERD - Continue Famotidine 20 mg twice daily  **Had extensive conversation with the patient, her daughter, and her son who are at bedside.  Will plan to start Lasix and spironolactone at low doses today.  She has only been taking the carvedilol once a day as she was nervous about taking it.  I have encouraged her to take it twice a day.  Would like to see how she does today with all these medications, make sure blood pressure is okay and make sure electrolytes and renal function are good in the morning.  If tolerates this then can be discharged home tomorrow.  She has an appointment in our office on 12/10.    LOS: 1 day   Harlene BIRCH. Zehr  03/24/2024, 9:12 AM  ---------------------------------------------------------------------------------------------  I have taken a history, reviewed the chart and examined the patient. I performed a substantive portion of this encounter, including complete performance of at least one of the key components, in conjunction with the APP. I agree with the APP's note, impression and recommendations  Patient feeling very well today.  No complaints.  Starting her diuretics today.  Not unreasonable to monitor patient overnight after starting diuretics and increasing her carvedilol. Recommend repeating CMP in 2 weeks. She has follow-up as outpatient in early December.  Can consider titrating carvedilol dose as outpatient.  Some studies indicate once daily dosing of 12.5 mg as the target dose (no need to titrate based on heart rate). Reinforced the importance of low-sodium diet to control ascites and limitation of carbohydrates and sugars to limit  further liver inflammation.  Greenly Rarick E. Stacia, MD [facility]  Moderate complex medical decision making (this includes chart review, review of results, face-to-face time used for counseling as well as treatment plan and follow-up. The patient was provided an opportunity to ask questions and all were answered. The patient agreed with the plan and demonstrated an understanding of the instructions

## 2024-03-24 NOTE — Assessment & Plan Note (Signed)
 DNR confirmed at the time of admission

## 2024-03-24 NOTE — Plan of Care (Signed)

## 2024-03-24 NOTE — Progress Notes (Signed)
 Progress Note   Patient: Robin Hayes FMW:[id number] DOB: 05-11-1940 DOA: 03/22/2024     1 DOS: the patient was seen and examined on 03/24/2024   Brief hospital course: 83yo with h/o HTN, HLD, pancreatitis, and choledocholithiasis s/p ERCP and cholecystectomy 10/25 who presented on 11/17 with abdominal pain and distention.  CT with ascites, observed for GI consult and paracentesis.      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 twice daily Likely home 11/20 if BP and renal function are stable Recommend patient be discharged on low-dose diuretics (Lasix 20 mg, Aldactone 50 mg) as well as the carvedilol for primary variceal bleeding prophylaxis Outpatient GI f/u recommended HTN (hypertension) Continue carvedilol Dyslipidemia Hold rosuvastatin Mild protein malnutrition In the setting of anemia and acute illness May benefit from protein supplementation Normocytic anemia Monitor Transfuse if needed DNR (do not resuscitate) DNR confirmed at the time of admission      Consultants: GI IR  Procedures: Paracentesis 11/18  Antibiotics: None  30 Day Unplanned Readmission Risk Score    Flowsheet Row ED to Hosp-Admission (Current) from 03/22/2024 in [facility]  30 Day Unplanned Readmission Risk Score (%) 12.73 Filed at 03/24/2024 0801    This score is the patient's risk of an unplanned readmission  within 30 days of being discharged (0 -100%). The score is based on dignosis, age, lab data, medications, orders, and past utilization.   Low:  0-14.9   Medium: 15-21.9   High: 22-29.9   Extreme: 30 and above           Subjective: Feeling much better, abdominal distention is resolved.   Objective: Vitals:   03/24/24 0532 03/24/24 1337  BP: 114/63 (!) 139/58  Pulse: 67 62  Resp: 16 16  Temp: 98.7 F (37.1 C) 98.2 F (36.8 C)  SpO2: 96% 95%    Intake/Output Summary (Last 24 hours) at 03/24/2024 1715 Last data filed at 03/23/2024 2100 Gross per 24 hour  Intake --  Output 100 ml  Net -100 ml   Filed Weights   03/24/24 1123  Weight: 68 kg    Exam:  General:  Appears calm and comfortable and is in NAD Eyes:  normal lids, iris ENT:  grossly normal hearing, lips & tongue, mmm; poor dentition Cardiovascular:  RRR. No LE edema.  Respiratory:   CTA bilaterally with no wheezes/rales/rhonchi.  Normal respiratory effort. Abdomen:  soft, NT, ND Skin:  no rash or induration seen on limited exam Musculoskeletal:  grossly normal tone BUE/BLE, good ROM, no bony abnormality Psychiatric:  grossly normal mood and affect, speech fluent and appropriate, AOx3 Neurologic:  CN 2-12 grossly intact, moves all extremities in coordinated fashion  Data Reviewed: I have reviewed the patient's lab results since admission.  Pertinent labs for today include:   AP 160, significantly improved Albumin 2.5 WBC 4.3 Hgb 10.3 Platelets 116 Ascites fluid culture NTD     Family Communication: Daughter present     Code Status: Do not attempt resuscitation (DNR) PRE-ARREST INTERVENTIONS DESIRED   Disposition:  Status is: Inpatient Remains inpatient appropriate because: ongoing monitoring     Time spent: 50 minutes  Unresulted Labs (From admission, onward)     Start     Ordered   03/25/24 0500  CBC  Tomorrow morning,   R       Question:  Specimen collection method  Answer:  Lab=Lab  collect   03/24/24 1147   03/25/24 0500  Comprehensive metabolic panel  Tomorrow morning,   R       Question:  Specimen collection method  Answer:  Lab=Lab collect   03/24/24 1147             Author: Delon Herald, MD 03/24/2024 5:15 PM  For on call review [email or web address].

## 2024-03-24 NOTE — Plan of Care (Signed)

## 2024-03-24 NOTE — TOC Initial Note (Signed)
 Transition of Care [facility]) - Initial/Assessment Note    Patient Details  Name: Nautika Cressey MRN: [id number] Date of Birth: 1940-08-26  Transition of [facility] CM/SW Contact:    Doneta Glenys DASEN, RN Phone Number: 03/24/2024, 2:26 PM  Clinical Narrative:                 Presented for swelling/fluid in abdomen following cholecystectomy. PTA lives with Vivian (dtl) in a house;denies DME, HH,oxygen and SDOH needs;Vivian will transport at discharge. No needs identified.  Expected Discharge Plan: Home/Self Care Barriers to Discharge: No Barriers Identified   Patient Goals and CMS Choice Patient states their goals for this hospitalization and ongoing recovery are:: Home CMS Medicare.gov Compare Post Acute Care list provided to::  (NA) Choice offered to / list presented to : NA [place] ownership interest in [facility].provided to:: Parent NA    Expected Discharge Plan and Services In-house Referral: NA Discharge Planning Services: CM Consult Post Acute Care Choice: NA Living arrangements for the past 2 months: Single Family Home                 DME Arranged: N/A DME Agency: NA       HH Arranged: NA HH Agency: NA        Prior Living Arrangements/Services Living arrangements for the past 2 months: Single Family Home Lives with:: Adult Children Patient language and need for interpreter reviewed:: Yes Do you feel safe going back to the place where you live?: Yes      Need for Family Participation in Patient Care: No (Comment) Care giver support system in place?: Yes (comment) Current home services:  (NA) Criminal Activity/Legal Involvement Pertinent to Current Situation/Hospitalization: No - Comment as needed  Activities of Daily Living   ADL Screening (condition at time of admission) Independently performs ADLs?: Yes (appropriate for developmental age) Is the patient deaf or have difficulty hearing?: No Does the patient have difficulty seeing, even when  wearing glasses/contacts?: No Does the patient have difficulty concentrating, remembering, or making decisions?: No  Permission Sought/Granted Permission sought to share information with : Case Manager Permission granted to share information with : Yes, Verbal Permission Granted  Share Information with NAME: Mercer daughter           Emotional Assessment Appearance:: Appears stated age Attitude/Demeanor/Rapport: Engaged Affect (typically observed): Appropriate Orientation: : Oriented to Self, Oriented to Place, Oriented to  Time, Oriented to Situation Alcohol / Substance Use: Not Applicable Psych Involvement: No (comment)  Admission diagnosis:  Other ascites [R18.8] Abdominal ascites [R18.8] Patient Active Problem List   Diagnosis Date Noted   Abdominal ascites 03/23/2024   Mild protein malnutrition 03/23/2024   Normocytic anemia 03/23/2024   Obstructive jaundice (HCC) 02/29/2024   Dilated bile duct 02/29/2024   Splenomegaly 02/29/2024   Thrombocytopenia 02/29/2024   Gastritis without bleeding 02/29/2024   Other cirrhosis of liver (HCC) 02/29/2024   Choledocholithiasis 02/27/2024   HTN (hypertension) 02/27/2024   Dyslipidemia 02/27/2024   Hypercholesterolemia 04/09/2020   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 [place], [place] - [address] [place] [address] Phone: [phone] Fax: [phone]  [id number] - [place], [place] - [address] [place] [address] Phone: [phone] Fax: [phone]  Social Drivers of Health (SDOH) Social History: SDOH Screenings   Food Insecurity: No Food Insecurity (03/23/2024)  Housing: Low Risk  (03/23/2024)  Transportation Needs: No Transportation Needs  (03/23/2024)  Utilities: Not At Risk (03/23/2024)  Financial Resource Strain: Low Risk  (07/14/2023)   Received from [facility]  Physical Activity: Sufficiently Active (07/14/2023)   Received from [facility]  Social Connections: Moderately Integrated (03/23/2024)  Stress: No Stress Concern Present (07/14/2023)   Received from [facility]  Tobacco Use: Low Risk  (03/23/2024)   SDOH Interventions:     Readmission Risk Interventions    03/24/2024    2:24 PM  Readmission Risk Prevention Plan  Post Dischage Appt Complete  Medication Screening Complete  Transportation Screening Complete

## 2024-03-25 ENCOUNTER — Other Ambulatory Visit (HOSPITAL_COMMUNITY): Payer: Self-pay

## 2024-03-25 ENCOUNTER — Ambulatory Visit: Payer: Self-pay | Admitting: Nurse Practitioner

## 2024-03-25 DIAGNOSIS — R188 Other ascites: Secondary | ICD-10-CM | POA: Diagnosis not present

## 2024-03-25 LAB — COMPREHENSIVE METABOLIC PANEL WITH GFR
ALT: 15 U/L (ref 0–44)
AST: 39 U/L (ref 15–41)
Albumin: 2.9 g/dL — ABNORMAL LOW (ref 3.5–5.0)
Alkaline Phosphatase: 181 U/L — ABNORMAL HIGH (ref 38–126)
Anion gap: 8 (ref 5–15)
BUN: 12 mg/dL (ref 8–23)
CO2: 26 mmol/L (ref 22–32)
Calcium: 8.4 mg/dL — ABNORMAL LOW (ref 8.9–10.3)
Chloride: 107 mmol/L (ref 98–111)
Creatinine, Ser: 0.66 mg/dL (ref 0.44–1.00)
GFR, Estimated: >60 mL/min (ref >60)
Glucose, Bld: 84 mg/dL (ref 70–99)
Potassium: 4.3 mmol/L (ref 3.5–5.1)
Sodium: 141 mmol/L (ref 135–145)
Total Bilirubin: 0.9 mg/dL (ref 0.0–1.2)
Total Protein: 5.7 g/dL — ABNORMAL LOW (ref 6.5–8.1)

## 2024-03-25 LAB — CBC
HCT: 34.6 % — ABNORMAL LOW (ref 36.0–46.0)
Hemoglobin: 11.5 g/dL — ABNORMAL LOW (ref 12.0–15.0)
MCH: 31.3 pg (ref 26.0–34.0)
MCHC: 33.2 g/dL (ref 30.0–36.0)
MCV: 94.3 fL (ref 80.0–100.0)
Platelets: 139 K/uL — ABNORMAL LOW (ref 150–400)
RBC: 3.67 MIL/uL — ABNORMAL LOW (ref 3.87–5.11)
RDW: 13.3 % (ref 11.5–15.5)
WBC: 5.3 K/uL (ref 4.0–10.5)
nRBC: 0 % (ref 0.0–0.2)

## 2024-03-25 LAB — CYTOLOGY - NON PAP

## 2024-03-25 MED ORDER — SPIRONOLACTONE 50 MG PO TABS
50.0000 mg | ORAL_TABLET | Freq: Every day | ORAL | 0 refills | Status: DC
  Filled 2024-03-25: qty 30, 30d supply, fill #0

## 2024-03-25 MED ORDER — CARVEDILOL 3.125 MG PO TABS
3.1250 mg | ORAL_TABLET | Freq: Two times a day (BID) | ORAL | 0 refills | Status: AC
  Filled 2024-03-25: qty 60, 30d supply, fill #0

## 2024-03-25 MED ORDER — FUROSEMIDE 20 MG PO TABS
20.0000 mg | ORAL_TABLET | Freq: Every day | ORAL | 0 refills | Status: DC
  Filled 2024-03-25: qty 30, 30d supply, fill #0

## 2024-03-25 NOTE — Assessment & Plan Note (Addendum)
 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 twice daily Home 11/20 given that BP and renal function are stable Recommend patient be discharged on low-dose diuretics (Lasix 20 mg, Aldactone 50 mg) as well as the carvedilol for primary variceal bleeding prophylaxis Outpatient GI f/u recommended

## 2024-03-25 NOTE — Assessment & Plan Note (Signed)
-   Continue carvedilol

## 2024-03-25 NOTE — Plan of Care (Signed)

## 2024-03-25 NOTE — Assessment & Plan Note (Signed)
Hold rosuvastatin. 

## 2024-03-25 NOTE — Assessment & Plan Note (Signed)
 In the setting of anemia and acute illness May benefit from protein supplementation

## 2024-03-25 NOTE — Discharge Summary (Signed)
 Physician Discharge Summary   Patient: Robin Hayes MRN: [id number] DOB: Oct 09, 1940  Admit date:     03/22/2024  Discharge date: 03/25/24  Discharge Physician: Delon Herald   PCP: Patient, No Pcp Per   Recommendations at discharge:   Take carvedilol (Coreg) twice daily Take diuretics (furosemide/Lasix 20 mg and spironolactone/Aldactone 50 mg) once daily Follow up with PCP (in [facility] per family) next week for recheck and labs Follow up with GI next month as scheduled  Discharge Diagnoses: Principal Problem:   Abdominal ascites Active Problems:   HTN (hypertension)   Dyslipidemia   Other cirrhosis of liver (HCC)   Elevated LFTs   Mild protein malnutrition   Normocytic anemia   DNR (do not resuscitate)    Hospital Course: 83yo with h/o HTN, HLD, pancreatitis, and choledocholithiasis s/p ERCP and cholecystectomy 10/25 who presented on 11/17 with abdominal pain and distention.  CT with ascites, observed for GI consult and paracentesis.    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 and patient was instructed, not to drive, operate heavy machinery, perform activities at heights, swimming or participation in water activities or provide baby-sitting services while on Pain, Sleep and Anxiety Medications; until their outpatient Physician has advised to do so again. Also recommended to not to take more than prescribed Pain, Sleep and Anxiety Medications.   Disposition: Home Diet recommendation:  Cardiac diet DISCHARGE MEDICATION: Allergies as of 03/25/2024   No Known Allergies      Medication List     STOP taking these medications    famotidine 20 MG tablet Commonly known as: PEPCID   Fish Oil 1000 MG Caps   magnesium gluconate 500 MG tablet Commonly known as: MAGONATE   polyethylene glycol powder 17 GM/SCOOP powder Commonly known as: GLYCOLAX/MIRALAX   VITAMIN K2-VITAMIN D3 PO       TAKE these medications    carvedilol 3.125 MG tablet Commonly known as: COREG Take 1 tablet (3.125 mg total) by mouth 2 (two) times daily with a meal. What changed: when to take this   furosemide 20 MG tablet Commonly known as: LASIX Take 1 tablet (20 mg total) by mouth daily. Start taking on: March 26, 2024   rosuvastatin 20 MG tablet Commonly known as: CRESTOR Take 20 mg by mouth daily.   spironolactone 50 MG tablet Commonly known as: ALDACTONE Take 1 tablet (50  mg total) by mouth daily. Start taking on: March 26, 2024        Follow-up Information     [facility] Follow up.          PCP. Schedule an appointment as soon as possible for a  visit in 1 week(s).                 Discharge Exam:   Subjective: Feeling great and wants to go home now.   Objective: Vitals:   03/24/24 1954 03/25/24 0530  BP: (!) 115/57 (!) 127/57  Pulse: 62 62  Resp: 16 16  Temp: 99.2 F (37.3 C) 98.5 F (36.9 C)  SpO2: 94% 93%   No intake or output data in the 24 hours ending 03/25/24 1033 Filed Weights   03/24/24 1123  Weight: 68 kg    Exam:  General:  Appears calm and comfortable and is in NAD Eyes:  normal lids, iris ENT:  grossly normal hearing, lips & tongue, mmm; poor dentition Cardiovascular:  RRR, no m/r/g. No LE edema.  Respiratory:   CTA bilaterally with no wheezes/rales/rhonchi.  Normal respiratory effort. Abdomen:  soft, NT, ND Skin:  no rash or induration seen on limited exam Musculoskeletal:  grossly normal tone BUE/BLE, good ROM, no bony abnormality Psychiatric:  grossly normal mood and affect, speech fluent and appropriate, AOx3 Neurologic:  CN 2-12 grossly intact, moves all extremities in coordinated fashion  Data Reviewed: I have reviewed the patient's lab results since admission.  Pertinent labs for today include:  AP 181 Albumin  2.9 WBC 5.3 Hgb 11.5 Platelets 139 Paracentesis culture NTD  Paracentesis cytology pending  Condition at discharge: improving  The results of significant diagnostics from this hospitalization (including imaging, microbiology, ancillary and laboratory) are listed below for reference.   Imaging Studies: US  Paracentesis Result Date: 03/23/2024 INDICATION: Patient with history of pancreatitis, cirrhosis, portal hypertension, esophageal varices, ascites, splenomegaly. Request received for diagnostic and therapeutic paracentesis. EXAM: ULTRASOUND GUIDED DIAGNOSTIC AND THERAPEUTIC PARACENTESIS MEDICATIONS: 8 mL 1% lidocaine  with epinephrine  to skin/subcutaneous tissue COMPLICATIONS: None immediate. PROCEDURE: Informed written consent was obtained from the patient via  interpreter after a discussion of the risks, benefits and alternatives to treatment. A timeout was performed prior to the initiation of the procedure. Initial ultrasound scanning demonstrates a moderate to large amount of ascites within the left lower abdominal quadrant. The left lower abdomen was prepped and draped in the usual sterile fashion. 1% lidocaine  with epinephrine  was used for local anesthesia. Following this, a 19 gauge, 10-cm, Yueh catheter was introduced. An ultrasound image was saved for documentation purposes. The paracentesis was performed. The catheter was removed and a dressing was applied. The patient tolerated the procedure well without immediate post procedural complication. FINDINGS: A total of approximately 3.7 liters of slightly hazy, yellow fluid was removed. Samples were sent to the laboratory as requested by the clinical team. IMPRESSION: Successful ultrasound-guided diagnostic and therapeutic paracentesis yielding 3.7 liters of peritoneal fluid. PLAN: If the patient eventually requires >/=2 paracenteses in a 30 day period, candidacy for formal evaluation by the [facility] Hypert[facility] will be assessed. Performed by: Franky Rakers, PA-C Electronically Signed   By: Juliene Balder M.D.   On: 03/23/2024 17:55   MR ABDOMEN MRCP W WO CONTAST Addendum Date: 03/23/2024 ADDENDUM REPORT: 03/23/2024 13:13 ADDENDUM: Liver contour appears slightly nodular concerning for cirrhosis. Stigmata of portal hypertension recanalization of periumbilical vein, splenomegaly and ascites. No suspicious focal liver observation. Correlate with liver function  tests. Findings were discussed with NP Nyle Barnacle by Dr Megan Zare on 11.18.2025 at 11:30 a.m. Electronically Signed   By: Megan  Zare M.D.   On: 03/23/2024 13:13   Result Date: 03/23/2024 CLINICAL DATA:  Pancreatitis suspected, persistent pain and elevated LFTs EXAM: MRI ABDOMEN WITHOUT AND WITH CONTRAST (INCLUDING  MRCP) TECHNIQUE: Multiplanar multisequence MR imaging of the abdomen was performed both before and after the administration of intravenous contrast. Heavily T2-weighted images of the biliary and pancreatic ducts were obtained, and three-dimensional MRCP images were rendered by post processing. CONTRAST:  6mL GADAVIST GADOBUTROL 1 MMOL/ML IV SOLN COMPARISON:  None Available. FINDINGS: Lower chest: No acute abnormality. Hepatobiliary: No solid liver abnormality is seen. Cholecystectomy. Severe intra and extrahepatic biliary ductal dilatation, the common bile duct measuring up to 1.6 cm in caliber. Choledocholithiasis, 0.7 cm calculus at the ampulla (series 3, image 16). Pancreas: Unremarkable. No pancreatic ductal dilatation or surrounding inflammatory changes. Spleen: Mild splenomegaly, maximum coronal span 13.5 cm Adrenals/Urinary Tract: Adrenal glands are unremarkable. Kidneys are normal, without renal calculi, solid lesion, or hydronephrosis. Stomach/Bowel: Stomach is within normal limits. No evidence of bowel wall thickening, distention, or inflammatory changes. Vascular/Lymphatic: Aortic atherosclerosis. No enlarged abdominal lymph nodes. Other: No abdominal wall hernia or abnormality. Moderate volume ascites throughout the abdomen and pelvis. Musculoskeletal: No acute or significant osseous findings. IMPRESSION: 1. Choledocholithiasis, 0.7 cm calculus at the ampulla. 2. Severe intra and extrahepatic biliary ductal dilatation, the common bile duct measuring up to 1.6 cm in caliber. 3. Cholecystectomy. 4. Moderate volume ascites throughout the abdomen and pelvis. 5. Mild splenomegaly. These results will be called to the ordering clinician or representative by the Radiologist Assistant, and communication documented in the PACS or [employer]. Aortic Atherosclerosis (ICD10-I70.0). Electronically Signed: By: Marolyn JONETTA Jaksch M.D. On: 02/26/2024 11:44   MR 3D Recon At Scanner Addendum Date: 03/23/2024 ADDENDUM  REPORT: 03/23/2024 13:13 ADDENDUM: Liver contour appears slightly nodular concerning for cirrhosis. Stigmata of portal hypertension recanalization of periumbilical vein, splenomegaly and ascites. No suspicious focal liver observation. Correlate with liver function tests. Findings were discussed with NP Nyle Barnacle by Dr Megan Zare on 11.18.2025 at 11:30 a.m. Electronically Signed   By: Megan  Zare M.D.   On: 03/23/2024 13:13   Result Date: 03/23/2024 CLINICAL DATA:  Pancreatitis suspected, persistent pain and elevated LFTs EXAM: MRI ABDOMEN WITHOUT AND WITH CONTRAST (INCLUDING MRCP) TECHNIQUE: Multiplanar multisequence MR imaging of the abdomen was performed both before and after the administration of intravenous contrast. Heavily T2-weighted images of the biliary and pancreatic ducts were obtained, and three-dimensional MRCP images were rendered by post processing. CONTRAST:  6mL GADAVIST GADOBUTROL 1 MMOL/ML IV SOLN COMPARISON:  None Available. FINDINGS: Lower chest: No acute abnormality. Hepatobiliary: No solid liver abnormality is seen. Cholecystectomy. Severe intra and extrahepatic biliary ductal dilatation, the common bile duct measuring up to 1.6 cm in caliber. Choledocholithiasis, 0.7 cm calculus at the ampulla (series 3, image 16). Pancreas: Unremarkable. No pancreatic ductal dilatation or surrounding inflammatory changes. Spleen: Mild splenomegaly, maximum coronal span 13.5 cm Adrenals/Urinary Tract: Adrenal glands are unremarkable. Kidneys are normal, without renal calculi, solid lesion, or hydronephrosis. Stomach/Bowel: Stomach is within normal limits. No evidence of bowel wall thickening, distention, or inflammatory changes. Vascular/Lymphatic: Aortic atherosclerosis. No enlarged abdominal lymph nodes. Other: No abdominal wall hernia or abnormality. Moderate volume ascites throughout the abdomen and pelvis. Musculoskeletal: No acute or significant osseous findings. IMPRESSION: 1.  Choledocholithiasis, 0.7 cm calculus at the ampulla. 2. Severe intra and extrahepatic biliary  ductal dilatation, the common bile duct measuring up to 1.6 cm in caliber. 3. Cholecystectomy. 4. Moderate volume ascites throughout the abdomen and pelvis. 5. Mild splenomegaly. These results will be called to the ordering clinician or representative by the Radiologist Assistant, and communication documented in the PACS or [employer]. Aortic Atherosclerosis (ICD10-I70.0). Electronically Signed: By: Marolyn JONETTA Jaksch M.D. On: 02/26/2024 11:44   CT ABDOMEN PELVIS W CONTRAST Result Date: 03/22/2024 CLINICAL DATA:  History of cholecystectomy and recent ERCP with stone found in bile duct, presenting with chills, tremor and abdominal and leg swelling. EXAM: CT ABDOMEN AND PELVIS WITH CONTRAST TECHNIQUE: Multidetector CT imaging of the abdomen and pelvis was performed using the standard protocol following bolus administration of intravenous contrast. RADIATION DOSE REDUCTION: This exam was performed according to the departmental dose-optimization program which includes automated exposure control, adjustment of the mA and/or kV according to patient size and/or use of iterative reconstruction technique. CONTRAST:  85mL OMNIPAQUE IOHEXOL 300 MG/ML  SOLN COMPARISON:  None Available. FINDINGS: Lower chest: Hazy, mild to moderate severity areas of atelectasis and/or infiltrate are seen within the bilateral lung bases. Hepatobiliary: No focal liver abnormality is seen. Status post cholecystectomy. Air is noted throughout the common bile duct. Pancreas: Unremarkable. No pancreatic ductal dilatation or surrounding inflammatory changes. Spleen: Normal in size without focal abnormality. Adrenals/Urinary Tract: Adrenal glands are unremarkable. Kidneys are normal, without renal calculi, focal lesion, or hydronephrosis. A small amount of air is seen within the lumen of a poorly distended urinary bladder. Stomach/Bowel: There is a small  hiatal hernia. The appendix is not clearly identified. No evidence of bowel wall thickening or bowel distention. Mild, hazy mesenteric inflammatory stranding is seen within the anterior aspect of the upper abdomen. Vascular/Lymphatic: Aortic atherosclerosis. No enlarged abdominal or pelvic lymph nodes. Reproductive: Uterus and bilateral adnexa are unremarkable. Other: There is mild anterior and lateral abdominal and pelvic wall anasarca. A large amount of abdominopelvic ascites is seen. Musculoskeletal: Multilevel degenerative changes are noted within the lumbar spine, most prominent at the levels of L2-L3 and L3-L4. IMPRESSION: 1. Hazy, mild to moderate severity bibasilar atelectasis and/or infiltrate. 2. Large amount of abdominopelvic ascites. 3. Small hiatal hernia. 4. Status post cholecystectomy with air noted throughout the common bile duct. 5. Small amount of air within the lumen of a poorly distended urinary bladder. While this may represent sequelae associated with recent bladder instrumentation, correlation with urinalysis is recommended to exclude the presence of cystitis. 6. Mesenteric stranding within the anterior aspect of the mid and upper abdomen. While this may be inflammatory in origin, sequelae associated with a neoplastic process involving the omentum cannot be excluded. 7. Aortic atherosclerosis. Electronically Signed   By: Suzen Dials M.D.   On: 03/22/2024 21:16   DG ERCP Result Date: 03/01/2024 CLINICAL DATA:  [id number] Surgery, elective [id number] pancreatitis. Choledocholithiasis. EXAM: ERCP COMPARISON:  MRCP, 02/24/2024. FLUOROSCOPY: Exposure Index (as provided by the fluoroscopic device): 67.3 mGy Kerma FINDINGS: Limited oblique planar images of the RIGHT upper quadrant obtained C-arm. Images demonstrating flexible endoscopy, biliary duct cannulation, sphincterotomy, retrograde cholangiogram and balloon sweep. Pancreatic stent placement. Mild extrahepatic biliary ductal and a biliary  filling defect are demonstrated. IMPRESSION: Fluoroscopic imaging for ERCP and pancreatic stent placement. Choledocholithiasis with mild extrahepatic biliary ductal dilatation. For complete description of intra procedural findings, please see performing service dictation. Electronically Signed   By: Thom Hall M.D.   On: 03/01/2024 10:03   DG C-Arm 1-60 Min-No Report Result Date: 02/29/2024 Fluoroscopy was utilized  by the requesting physician.  No radiographic interpretation.    Microbiology: Results for orders placed or performed during the hospital encounter of 03/22/24  Aerobic/Anaerobic Culture w Gram Stain (surgical/deep wound)     Status: None (Preliminary result)   Collection Time: 03/23/24  2:25 PM   Specimen: PATH Cytology Peritoneal fluid  Result Value Ref Range Status   Specimen Description   Final    PERITONEAL Performed at [facility], [address], [place], [place] [address]    Special Requests   Final    NONE Performed at [facility], [address] [place], [place] [address]    Gram Stain NO WBC SEEN NO ORGANISMS SEEN   Final   Culture   Final    NO GROWTH 2 DAYS Performed at [facility], [address], [place], [place] [address]    Report Status PENDING  Incomplete    Labs: CBC: Recent Labs  Lab 03/22/24 1802 03/23/24 0910 03/24/24 0424 03/25/24 0458  WBC 6.7 5.3 4.3 5.3  NEUTROABS 3.3  --   --   --   HGB 13.3 11.8* 10.3* 11.5*  HCT 39.9 35.0* 31.4* 34.6*  MCV 91.7 92.8 94.3 94.3  PLT 195 152 116* 139*   Basic Metabolic Panel: Recent Labs  Lab 03/22/24 1910 03/23/24 0910 03/24/24 0424 03/25/24 0458  NA 137 141 139 141  K 4.8 3.9 4.2 4.3  CL 104 107 107 107  CO2 24 26 26 26   GLUCOSE 112* 88 76 84  BUN 11 10 11 12   CREATININE 0.61 0.54 0.58 0.66  CALCIUM 8.8* 8.5* 8.0* 8.4*  MG  --  1.9  --   --   PHOS  --  3.1  --   --    Liver Function Tests: Recent Labs  Lab 03/22/24 1910  03/23/24 0910 03/24/24 0424 03/25/24 0458  AST 57* 39 36 39  ALT 21 21 15 15   ALKPHOS 232* 192* 160* 181*  BILITOT 1.4* 1.1 1.0 0.9  PROT 7.0 6.2* 5.1* 5.7*  ALBUMIN  3.0* 2.8* 2.5* 2.9*   CBG: No results for input(s): GLUCAP in the last 168 hours.  Discharge time spent: greater than 30 minutes.  Signed: Delon Herald, MD [facility] 03/25/2024

## 2024-03-25 NOTE — Assessment & Plan Note (Signed)
 DNR confirmed at the time of admission

## 2024-03-25 NOTE — Progress Notes (Addendum)
 [name]  CC:  Abdominal pain, ascites, s/p ERCP 2 weeks ago  Subjective: She endorses feeling quite well today.  She is tolerating a low-sodium diet.  No nausea or vomiting.  No abdominal pain.  She was discharged by the hospitalist and is pleased to go home today.  Son at the bedside who speaks English, he interpreted to facilitate communication with the patient at this time.   Objective:  Vital signs in last 24 hours: Temp:  [98.2 F (36.8 C)-99.2 F (37.3 C)] 98.5 F (36.9 C) (11/20 0530) Pulse Rate:  [62] 62 (11/20 0530) Resp:  [16] 16 (11/20 0530) BP: (115-139)/(57-58) 127/57 (11/20 0530) SpO2:  [93 %-95 %] 93 % (11/20 0530) Weight:  [68 kg] 68 kg (11/19 1123) Last BM Date : 03/24/24 General: Alert 83 year old female in no acute distress. Eyes: No scleral icterus. Heart: Regular rate and rhythm, no murmurs. Pulm: Breath sounds clear.  On room air. Abdomen: Soft, nondistended. Small amount of ascites, abdomen is not tense. No palpable mass. Positive bowel sounds x 4 quadrants.  Extremities: No lower extremity edema. Neurologic:  Alert and oriented x 4.  Speech is clear.  Moves all extremities equally. Psych:  Alert and cooperative. Normal mood and affect.  Intake/Output from previous day: No intake/output data recorded. Intake/Output this shift: No intake/output data recorded.  Lab Results: Recent Labs    03/23/24 0910 03/24/24 0424 03/25/24 0458  WBC 5.3 4.3 5.3  HGB 11.8* 10.3* 11.5*  HCT 35.0* 31.4* 34.6*  PLT 152 116* 139*   BMET Recent Labs    03/23/24 0910 03/24/24 0424 03/25/24 0458  NA 141 139 141  K 3.9 4.2 4.3  CL 107 107 107  CO2 26 26 26   GLUCOSE 88 76 84  BUN 10 11 12   CREATININE 0.54 0.58 0.66  CALCIUM 8.5* 8.0* 8.4*   LFT Recent Labs    03/23/24 0910 03/24/24 0424 03/25/24 0458  PROT 6.2*   < > 5.7*  ALBUMIN  2.8*   < > 2.9*  AST 39   < > 39  ALT 21   < > 15  ALKPHOS 192*   < > 181*  BILITOT  1.1   < > 0.9  BILIDIR 0.8*  --   --   IBILI 0.3  --   --    < > = values in this interval not displayed.   PT/INR Recent Labs    03/23/24 0910  LABPROT 15.3*  INR 1.1   Hepatitis Panel No results for input(s): HEPBSAG, HCVAB, HEPAIGM, HEPBIGM in the last 72 hours.  US  Paracentesis Result Date: 03/23/2024 INDICATION: Patient with history of pancreatitis, cirrhosis, portal hypertension, esophageal varices, ascites, splenomegaly. Request received for diagnostic and therapeutic paracentesis. EXAM: ULTRASOUND GUIDED DIAGNOSTIC AND THERAPEUTIC PARACENTESIS MEDICATIONS: 8 mL 1% lidocaine  with epinephrine  to skin/subcutaneous tissue COMPLICATIONS: None immediate. PROCEDURE: Informed written consent was obtained from the patient via interpreter after a discussion of the risks, benefits and alternatives to treatment. A timeout was performed prior to the initiation of the procedure. Initial ultrasound scanning demonstrates a moderate to large amount of ascites within the left lower abdominal quadrant. The left lower abdomen was prepped and draped in the usual sterile fashion. 1% lidocaine  with epinephrine  was used for local anesthesia. Following this, a 19 gauge, 10-cm, Yueh catheter was introduced. An ultrasound image was saved for documentation purposes. The paracentesis was performed. The catheter was removed and a dressing was applied. The patient tolerated the  procedure well without immediate post procedural complication. FINDINGS: A total of approximately 3.7 liters of slightly hazy, yellow fluid was removed. Samples were sent to the laboratory as requested by the clinical team. IMPRESSION: Successful ultrasound-guided diagnostic and therapeutic paracentesis yielding 3.7 liters of peritoneal fluid. PLAN: If the patient eventually requires >/=2 paracenteses in a 30 day period, candidacy for formal evaluation by the [facility] will be assessed.  Performed by: Franky Rakers, PA-C Electronically Signed   By: Juliene Balder M.D.   On: 03/23/2024 17:55   Patient Profile:  Robin Hayes is a 83 y.o. 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   Assessment / Plan:  83 year old female newly diagnosed with cirrhosis with portal hypertension, esophageal varices, ascites and splenomegaly.  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 MELD 3.0: 10. Elevated LFTs since at least 07/2023.  Admitted with worsening abdominal distention/ascites status post ERCP 02/29/2024. CTAP with contrast 11/17 showed a nodular liver contour consistent with cirrhosis per second read by radiologist Dr. Terri, air noted throughout the CBD, post cholecystectomy physiology, a normal spleen moderately large amount of abdominal/pelvic ascites and mesenteric stranding within the anterior aspect of the mid/upper abdomen query sequela of neoplastic process involving the omentum. S/P paracentesis 11/18, 3.7 L of peritoneal fluid removed. No SBP. - Patient discharged home today per the hospitalist  - Continue Carvedilol  3.125 mg twice daily - Continue Furosemide  20 mg every morning and Spironolactone  50 mg every morning - 2 g low-sodium diet. Reinforced the importance of low-sodium diet to control ascites and limitation of carbohydrates and sugars to limit further liver inflammation.  - Patient/family will contact our office if she develops abdominal swelling prior to her follow-up appointment - Patient has a follow-up appointment with Lauraine Furbish in our GI clinic 04/14/2024 at 10 AM   Prior RUQ pain with elevated LFTs. MRI/MRCP with and without contrast 02/24/2024 which identified choledocholithiasis at the ampulla, severe  intra/extrahepatic biliary ductal dilatation, CBD measuring up to 1.6 cm, postcholecystectomy physiology, moderate volume of ascites and mild splenomegaly. EGD/ERCP by Dr. Wilhelmenia 02/29/2024 which identified grade 1, 2 and 3 esophageal varices in the middle/distal esophagus, portal hypertensive gastropathy.  The major papilla appeared congested, status post biliary fistulotomy and 1 temporary plastic pancreatic stent was placed in the ventral pancreatic duct, a filling defect was consistent with a stone and sludge on cholangiogram, choledocholithiasis was found and was successfully removed.  Total bili 1.1.  Alk phos 192.  AST 39.  ALT 21.  CTAP during this hospitalization showed PD stent no longer present.   Mild normocytic anemia.  Hemoglobin 13.3 -> Hg 11.8.  No overt GI bleeding.   History of hypertension and hyperlipidemia   GERD - Continue Famotidine  20 mg twice daily    Principal Problem:   Abdominal ascites Active Problems:   HTN (hypertension)   Dyslipidemia   Other cirrhosis of liver (HCC)   Elevated LFTs   Mild protein malnutrition   Normocytic anemia   DNR (do not resuscitate)     LOS: 2 days   Robin Hayes  03/25/2024, 11:10 AM  --------------------------------------------------------  I agree with the APP's note, impression and recommendations.  Patient was discharged home before I could see her.  Belen Pesch E. Stacia, MD [facility]

## 2024-03-25 NOTE — Plan of Care (Signed)

## 2024-03-25 NOTE — Assessment & Plan Note (Signed)
 Monitor Transfuse if needed

## 2024-03-25 NOTE — Progress Notes (Signed)
 Discharge medications delivered to patient at the bedside.

## 2024-03-26 ENCOUNTER — Ambulatory Visit: Payer: Self-pay | Admitting: Gastroenterology

## 2024-03-28 LAB — AEROBIC/ANAEROBIC CULTURE W GRAM STAIN (SURGICAL/DEEP WOUND)
Culture: NO GROWTH
Gram Stain: NONE SEEN

## 2024-04-14 ENCOUNTER — Encounter: Payer: Self-pay | Admitting: Gastroenterology

## 2024-04-14 ENCOUNTER — Ambulatory Visit: Admitting: Gastroenterology

## 2024-04-14 ENCOUNTER — Other Ambulatory Visit (INDEPENDENT_AMBULATORY_CARE_PROVIDER_SITE_OTHER)

## 2024-04-14 VITALS — BP 116/48 | HR 48 | Ht 58.5 in | Wt 136.5 lb

## 2024-04-14 DIAGNOSIS — I8500 Esophageal varices without bleeding: Secondary | ICD-10-CM

## 2024-04-14 DIAGNOSIS — K3189 Other diseases of stomach and duodenum: Secondary | ICD-10-CM

## 2024-04-14 DIAGNOSIS — D696 Thrombocytopenia, unspecified: Secondary | ICD-10-CM

## 2024-04-14 DIAGNOSIS — K7469 Other cirrhosis of liver: Secondary | ICD-10-CM | POA: Diagnosis not present

## 2024-04-14 DIAGNOSIS — K5901 Slow transit constipation: Secondary | ICD-10-CM

## 2024-04-14 DIAGNOSIS — K7460 Unspecified cirrhosis of liver: Secondary | ICD-10-CM | POA: Diagnosis not present

## 2024-04-14 DIAGNOSIS — K766 Portal hypertension: Secondary | ICD-10-CM | POA: Diagnosis not present

## 2024-04-14 LAB — CBC WITH DIFFERENTIAL/PLATELET
Basophils Absolute: 0 K/uL (ref 0.0–0.1)
Basophils Relative: 0.8 % (ref 0.0–3.0)
Eosinophils Absolute: 0.4 K/uL (ref 0.0–0.7)
Eosinophils Relative: 5.9 % — ABNORMAL HIGH (ref 0.0–5.0)
HCT: 37.9 % (ref 36.0–46.0)
Hemoglobin: 12.7 g/dL (ref 12.0–15.0)
Lymphocytes Relative: 32.3 % (ref 12.0–46.0)
Lymphs Abs: 2.1 K/uL (ref 0.7–4.0)
MCHC: 33.4 g/dL (ref 30.0–36.0)
MCV: 91.1 fl (ref 78.0–100.0)
Monocytes Absolute: 0.4 K/uL (ref 0.1–1.0)
Monocytes Relative: 6.5 % (ref 3.0–12.0)
Neutro Abs: 3.5 K/uL (ref 1.4–7.7)
Neutrophils Relative %: 54.5 % (ref 43.0–77.0)
Platelets: 142 K/uL — ABNORMAL LOW (ref 150.0–400.0)
RBC: 4.16 Mil/uL (ref 3.87–5.11)
RDW: 13.8 % (ref 11.5–15.5)
WBC: 6.4 K/uL (ref 4.0–10.5)

## 2024-04-14 LAB — COMPREHENSIVE METABOLIC PANEL WITH GFR
ALT: 19 U/L (ref 0–35)
AST: 35 U/L (ref 0–37)
Albumin: 3.2 g/dL — ABNORMAL LOW (ref 3.5–5.2)
Alkaline Phosphatase: 126 U/L — ABNORMAL HIGH (ref 39–117)
BUN: 24 mg/dL — ABNORMAL HIGH (ref 6–23)
CO2: 29 meq/L (ref 19–32)
Calcium: 8.8 mg/dL (ref 8.4–10.5)
Chloride: 103 meq/L (ref 96–112)
Creatinine, Ser: 0.59 mg/dL (ref 0.40–1.20)
GFR: 84.56 mL/min (ref >60.00)
Glucose, Bld: 104 mg/dL — ABNORMAL HIGH (ref 70–99)
Potassium: 4.6 meq/L (ref 3.5–5.1)
Sodium: 138 meq/L (ref 135–145)
Total Bilirubin: 0.8 mg/dL (ref 0.2–1.2)
Total Protein: 7.1 g/dL (ref 6.0–8.3)

## 2024-04-14 LAB — PROTIME-INR
INR: 1.2 ratio — ABNORMAL HIGH (ref 0.8–1.0)
Prothrombin Time: 12.9 s (ref 9.6–13.1)

## 2024-04-14 NOTE — Patient Instructions (Addendum)
 Your provider has requested that you go to the basement level for lab work before leaving today. Press B on the elevator. The lab is located at the first door on the left as you exit the elevator.  Due to recent changes in healthcare laws, you may see the results of your imaging and laboratory studies on MyChart before your provider has had a chance to review them.  We understand that in some cases there may be results that are confusing or concerning to you. Not all laboratory results come back in the same time frame and the provider may be waiting for multiple results in order to interpret others.  Please give us  48 hours in order for your provider to thoroughly review all the results before contacting the office for clarification of your results.   Continue your current medications.  Check your weight daily.  A 2g low sodium diet.  You have been scheduled for an abdominal ultrasound at [facility] (1st floor of hospital) on 04/21/24 at 8:00 am. Please arrive 15 minutes prior to your appointment for registration. Make certain not to have anything to eat or drink 6 hours prior to your appointment. Should you need to reschedule your appointment, please contact radiology at [phone]. This test typically takes about 30 minutes to perform.  Follow up in 3 months with Dr. Wilhelmenia.   Thank you for trusting me with your gastrointestinal care!   Camie Furbish, PA-C  _______________________________________________________  If your blood pressure at your visit was 140/90 or greater, please contact your primary care physician to follow up on this.  _______________________________________________________  If you are age 24 or older, your body mass index should be between 23-30. Your Body mass index is 28.04 kg/m. If this is out of the aforementioned range listed, please consider follow up with your Primary Care Provider.  If you are age 69 or younger, your body mass index should be  between 19-25. Your Body mass index is 28.04 kg/m. If this is out of the aformentioned range listed, please consider follow up with your Primary Care Provider.   ________________________________________________________  The [facility] providers would like to encourage you to use MYCHART to communicate with providers for non-urgent requests or questions.  Due to long hold times on the telephone, sending your provider a message by [facility] may be a faster and more efficient way to get a response.  Please allow 48 business hours for a response.  Please remember that this is for non-urgent requests.  _______________________________________________________  [facility] is using a team-based approach to care.  Your team is made up of your doctor and two to three APPS. Our APPS (Nurse Practitioners and Physician Assistants) work with your physician to ensure care continuity for you. They are fully qualified to address your health concerns and develop a treatment plan. They communicate directly with your gastroenterologist to care for you. Seeing the Advanced Practice Practitioners on your physician's team can help you by facilitating care more promptly, often allowing for earlier appointments, access to diagnostic testing, procedures, and other specialty referrals.

## 2024-04-14 NOTE — Progress Notes (Signed)
 Robin Hayes [id number] Jan 08, 1941   Chief Complaint: Cirrhosis, hospital follow up  Referring Provider: No ref. provider found Primary GI MD: Dr. Wilhelmenia  HPI: Robin Hayes is a 83 y.o. female with past medical history of HTN, HLD, pancreatitis, slow transit constipation, cholecystectomy, ERCP 02/29/2024, Echo 01/12/2024 with LVEF 55-60%, right bundle branch block, mild diastolic dysfunction who presents today for follow-up.     12/23/2016 MRI of the abdomen/MRCP with no biliary ductal dilation, findings suggestive of acute cholecystitis, mild acute interstitial edematous pancreatitis and hepatic steatosis.     12/26/2016 CMP with an elevated ALT at 58 and AST at 72, alk phos elevated 176, normal T. bili.     06/11/2022 alk phos 125, AST 38, ALT 28, T. bili 0.3.     07/14/2023 alk phos 579, AST 120, ALT 128.  T. bili normal at 0.3.  Hep B surface antigen, core total antibody and surface antibody all nonreactive/negative.  Hep C antibody nonreactive.     07/31/2023 abdominal ultrasound with dilated CBD of 1.4 cm, may relate to postcholecystectomy changes, correlate with lab data, if abnormal follow-up MRCP.  Mild ectasia of the abdominal aorta.  Unremarkable sonographic appearance of the liver.     12/17/2023 abdominal x-ray nonspecific, moderate formed stool throughout the colon and rectum representing constipation.     12/17/2023 normal CBC, CMP with a total bili of 2.2, alk phos 885, AST 177, ALT 88 (07/23/2023 alk phos 662, AST 164, ALT 172).     12/17/2023 vitamin D low at 22.5.     01/22/2024 patient saw PCP and at that time was following up for constipation, cough and liver inflammation.  Discussed constipation successfully managed with MiraLAX .  At that time discussed diagnosis of liver inflammation with liver function test normal negative for hepatitis.  No alcohol use.  Tylenol  for knee pain.  Discussed a weight loss of 20 pounds over the past year.  Apparently on a heart  monitor for the past 2 weeks.  Echo revealed normal heart function with a mild leaky valve.  Cardiologist with plan for repeat ultrasound in 2 years.  Initially seen in office 02/23/2024 by Delon Failing, PA for elevated LFTs and right upper quadrant pain with dilated CBD and nausea.  MRI/MRCP was ordered for further evaluation, as well as additional workup of elevated liver enzymes.  Labs during that visit showed a total bilirubin level of 4.8, alk phos 893.  AST 195.  ALT 76.  LFTs were elevated since 07/2023.  ANA positive.  AMA < 20.  SMA < 20. INR 1.3.  A1AT 267.  Ceruloplasmin 37.  Celiac serology was negative.  Hepatitis A total antibody reactive.    MRCP showed choledocholithiasis and dilated CBD of 1.6 cm, moderate volume ascites throughout the abdomen and pelvis, mild splenomegaly (on addendum liver contour appears slightly nodular concerning for cirrhosis, with stigmata of portal hypertension).  She was advised to go to the ED for admission and to have ERCP.  Admitted to the hospital 02/27/2024 to 03/01/2024 and underwent ERCP, sphincterotomy, and stone extraction and plastic stent placement.  Clinically and biochemically improved after the procedure.  On endoscopy, patient also found to have evidence of portal hypertension and gastropathy.  GI recommended to restart on beta-blockers and schedule outpatient follow-up for repeat endoscopy and possible banding.  EGD/ERCP by Dr. Wilhelmenia 02/29/2024 identified grade 1, 2 and 3 esophageal varices in the middle/distal esophagus, portal hypertensive gastropathy.  The major papilla appeared congested, status post biliary fistulotomy and  1 temporary plastic pancreatic stent was placed in the ventral pancreatic duct, a filling defect was consistent with a stone and sludge on cholangiogram, choledocholithiasis was found and was successfully removed.  Carvedilol  6.25 mg once to twice daily was recommended.  Esophageal banding was deferred in setting of  large fistulotomy/sphincterotomy, if she had bleeding, having bands in place could have made things more difficult if bleeding occurred.   Labs 03/01/2024: Total bili 1.9. Alk phos 625. AST 92. ALT 48.   Again admitted to the hospital 03/22/2024 to 03/25/2024 after presenting with with abdominal ascites.  She underwent paracentesis with 3.7 L drained, albumin  25 g IV post paracentesis.  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  Cytology was negative.  GI was consulted, she was started on diuretics.  On carvedilol  3.125 mg p.o. twice daily.  Advised to follow-up with GI outpatient.  KUB showed no evidence of pancreatic stent in place.  Labs 03/25/2024: Hemoglobin 11.5, platelets 139, alk phos 181, AST 39, ALT 15, total bilirubin 0.9, albumin  2.9  MELD 3.0: 9 at 03/25/2024  4:58 AM MELD-Na: 7 at 03/25/2024  4:58 AM Calculated from: Serum Creatinine: 0.66 mg/dL (Using min of 1 mg/dL) at 88/79/7974  5:41 AM Serum Sodium: 141 mmol/L (Using max of 137 mmol/L) at 03/25/2024  4:58 AM Total Bilirubin: 0.9 mg/dL (Using min of 1 mg/dL) at 88/79/7974  5:41 AM Serum Albumin : 2.9 g/dL at 88/79/7974  5:41 AM INR(ratio): 1.1 at 03/23/2024  9:10 AM Age at listing (hypothetical): 25 years Sex: Female at 03/25/2024  4:58 AM   Had follow-up with PCP 04/05/2024.  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  History of Present Illness Marvelene Stoneberg is an 83 year old female with cirrhosis who presents for follow-up after recent hospitalizations.  Patient speaks Spanish.  Her daughter is here with her and interprets for patient.  Initially there was an interpreter with them, not present at time of visit.  They declined additional interpretation services.  Hepatic dysfunction and cirrhosis - Diagnosed with cirrhosis following hospitalization for elevated liver enzymes. - No alcohol consumption. - No confusion or jaundice.  Biliary obstruction and intervention - MRI during initial hospitalization revealed a bile duct stone. - Stone was removed during hospitalization.  Fluid retention and diuretic therapy - Developed significant fluid retention after bile duct stone removal, requiring a second hospitalization. - Underwent removal of 3-4 liters of fluid during second hospitalization. - Started on diuretics (Lasix  and spironolactone ) with improvement in symptoms, including appetite, sleep, and bowel movements. - Experienced weight loss since hospitalizations (following paracentesis)  Electrolyte monitoring and laboratory findings - Blood drawn two weeks ago to monitor electrolytes due to diuretic use. - Potassium level was slightly elevated; not taking potassium supplements. - No chest pain,  palpitations, or other symptoms related to hyperkalemia.  Gastrointestinal symptoms - Bowel movements improved with use of Miralax . - No blood in stool.  Current medications - Lasix  and spironolactone  for fluid management. - Carvedilol  for blood pressure and cardiac protection. - Cholesterol medication every other day. - Magnesium and omega supplements over the counter.   Previous GI  Procedures/Imaging   MRCP 02/24/2024 IMPRESSION: 1. Choledocholithiasis, 0.7 cm calculus at the ampulla. 2. Severe intra and extrahepatic biliary ductal dilatation, the common bile duct measuring up to 1.6 cm in caliber. 3. Cholecystectomy. 4. Moderate volume ascites throughout the abdomen and pelvis. 5. Mild splenomegaly.  ADDENDUM: Liver contour appears slightly nodular concerning for cirrhosis. Stigmata of portal hypertension recanalization of periumbilical vein, splenomegaly and ascites. No suspicious focal liver observation. Correlate with liver function tests.  ERCP 02/29/2024: - No gross lesions in the proximal esophagus. Grade I, grade II and grade III esophageal varices found in the middle/distal esophagus.  - Z-line irregular, 38 cm from the incisors.  - Portal hypertensive gastropathy.  - Erythematous mucosa in the stomach. Biopsied.  - No gross lesions in the duodenal bulb, in the first portion of the duodenum and in the second portion of the duodenum.  - The major papilla appeared congested.  - Difficult cannulation eventually requiring biliary fistulotomy precut to access the biliary tree as documented above.  - One temporary plastic pancreatic stent was placed into the ventral pancreatic duct.  - After biliary cannulation was obtained, a filling defect consistent with a stone and sludge was seen on the cholangiogram.  - The entire biliary tree was severely dilated.  - Choledocholithiasis was found. Complete removal was accomplished by biliary sphincterotomy/fistulotomy and balloon sweeping   A. GASTRIC BIOPSY:  - Chronic inactive gastritis with intestinal metaplasia  - Negative for H. pylori on HE stain  - Negative for dysplasia or malignancy    Past Medical History:  Diagnosis Date   Cirrhosis (HCC)    Heart burn    Hx of migraines    Hypercholesteremia    Hypertension    Pancreatitis    Slow transit constipation     Past Surgical History:  Procedure  Laterality Date   CESAREAN SECTION     CHOLECYSTECTOMY     ERCP N/A 02/29/2024   Procedure: ERCP, WITH INTERVENTION IF INDICATED;  Surgeon: Wilhelmenia Aloha Raddle., MD;  Location: [facility];  Service: Gastroenterology;  Laterality: N/A;    Current Outpatient Medications  Medication Sig Dispense Refill   carvedilol  (COREG ) 3.125 MG tablet Take 1 tablet (3.125 mg total) by mouth 2 (two) times daily with a meal. 60 tablet 0   furosemide  (LASIX ) 20 MG tablet Take 1 tablet (20 mg total) by mouth daily. 30 tablet 0   MAGNESIUM PO Take 1 capsule by mouth daily.     Omega-3 Fatty Acids (OMEGA 3 PO) Take 1 capsule by mouth daily.     polyethylene glycol powder (GLYCOLAX /MIRALAX ) 17 GM/SCOOP powder Take 17 g by mouth daily. Dissolve 1 capful (17g) in 4-8 ounces of liquid and take by mouth daily.     rosuvastatin (CRESTOR) 20 MG tablet Take 20 mg by mouth daily.     spironolactone  (ALDACTONE ) 50 MG tablet Take 1 tablet (50 mg total) by mouth daily. 30 tablet 0   Vitamin D-Vitamin K (VITAMIN K2 -VITAMIN D3 PO) Take 1 capsule by mouth daily.     No current facility-administered medications for this visit.    Allergies as of 04/14/2024   (No Known  Allergies)    Family History  Problem Relation Age of Onset   Other Daughter        malaria    Social History   Tobacco Use   Smoking status: Never   Smokeless tobacco: Never  Vaping Use   Vaping status: Never Used  Substance Use Topics   Alcohol use: No   Drug use: No     Review of Systems:    Constitutional: No unexplained weight loss, fever, chills Cardiovascular: No chest pain Respiratory: No SOB  Gastrointestinal: See HPI and otherwise negative   Physical Exam:  Vital signs: BP (!) 116/48 (BP Location: Left Arm, Patient Position: Sitting, Cuff Size: Normal)   Pulse (!) 48   Ht 4' 10.5 (1.486 m)   Wt 136 lb 8 oz (61.9 kg)   BMI 28.04 kg/m   Wt Readings from Last 3 Encounters:  04/14/24 136 lb 8 oz (61.9 kg)  03/24/24 149  lb 14.6 oz (68 kg)  02/28/24 151 lb 10.8 oz (68.8 kg)    Heart rate 60  Constitutional: Pleasant, well-appearing female in NAD, alert and cooperative Head:  Normocephalic and atraumatic.  Eyes: No scleral icterus.  Respiratory: Respirations even and unlabored. Lungs clear to auscultation bilaterally.  No wheezes, crackles, or rhonchi.  Cardiovascular:  Regular rate and rhythm. No murmurs. No peripheral edema. Gastrointestinal:  Soft, nondistended, nontender.  No obvious or tense ascites, feels this is normal size of her abdomen.  No rebound or guarding. Normal bowel sounds. No appreciable masses or hepatomegaly. Rectal:  Not performed.  Neurologic:  Alert and oriented x4;  grossly normal neurologically.  Negative asterixis. Skin:   Dry and intact without significant lesions or rashes. Psychiatric: Oriented to person, place and time. Demonstrates good judgement and reason without abnormal affect or behaviors.   RELEVANT LABS AND IMAGING: CBC    Component Value Date/Time   WBC 5.3 03/25/2024 0458   RBC 3.67 (L) 03/25/2024 0458   HGB 11.5 (L) 03/25/2024 0458   HCT 34.6 (L) 03/25/2024 0458   PLT 139 (L) 03/25/2024 0458   MCV 94.3 03/25/2024 0458   MCH 31.3 03/25/2024 0458   MCHC 33.2 03/25/2024 0458   RDW 13.3 03/25/2024 0458   LYMPHSABS 2.6 03/22/2024 1802   MONOABS 0.4 03/22/2024 1802   EOSABS 0.3 03/22/2024 1802   BASOSABS 0.1 03/22/2024 1802    CMP     Component Value Date/Time   NA 141 03/25/2024 0458   K 4.3 03/25/2024 0458   CL 107 03/25/2024 0458   CO2 26 03/25/2024 0458   GLUCOSE 84 03/25/2024 0458   BUN 12 03/25/2024 0458   CREATININE 0.66 03/25/2024 0458   CALCIUM 8.4 (L) 03/25/2024 0458   PROT 5.7 (L) 03/25/2024 0458   ALBUMIN  2.9 (L) 03/25/2024 0458   AST 39 03/25/2024 0458   ALT 15 03/25/2024 0458   ALKPHOS 181 (H) 03/25/2024 0458   BILITOT 0.9 03/25/2024 0458   GFRNONAA >60 03/25/2024 0458     Assessment/Plan:   Assessment & Plan 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 Portal hypertension with esophageal varices identified, risk of bleeding present. Liver function improving post-bile duct stone removal.  Developed ascites and required a second hospitalization.  Underwent paracentesis with 3.7 L of fluid removed.  Is on Lasix  and spironolactone  with no recurrence of ascites per patient, feels well.  Had labs drawn with PCP.  Continues to have improvement in liver enzymes, had mild elevation  in potassium to 5.7, will repeat today, may need adjustment in diuretics.  Up-to-date on HCC screening with imaging done in October.  Does need baseline AFP. She is immune to hep A, nonimmune to hep B and declines vaccination today.   She would like to travel to [place] in a month.   - Continue carvedilol  3.125 mg twice daily to prevent variceal bleeding. - Continue spironolactone  50 mg daily, Lasix  20 mg daily - 2 g low-sodium diet - Check daily weights - Recall has been placed for repeat EGD in April, may require variceal banding in future - Labs: CBC, CMP, PT/INR, AFP - No obvious ascites on exam today, however will order abdominal ultrasound to evaluate for this with paracentesis if evidence of ascites. - Discuss further recommendations with Dr. Wilhelmenia - Follow up with Dr. Wilhelmenia in 3 months - Will need MELD labs every 6 months - HCC screening every 6 months    Camie Furbish, PA-C [facility] 04/14/2024, 10:33 AM  Patient Care Team: Patient, No Pcp Per as PCP - General (General Practice)

## 2024-04-15 ENCOUNTER — Encounter: Payer: Self-pay | Admitting: Gastroenterology

## 2024-04-15 NOTE — Progress Notes (Signed)
 Attending Physician's Attestation   I have reviewed the chart.   I agree with the Advanced Practitioner's note, impression, and recommendations with any updates as below. I am glad to hear that she is doing better.  Now that she is on beta-blockade, her risk of bleeding should be lessened, but it is not impossible in the setting of her having grade 1 and grade 2 and grade 3 varices.  I do think relook endoscopy makes sense, but if she is doing well and seems to be doing well from a diuretic standpoint, she would be a reasonable candidate to travel with care.  For now continuing her diuretic therapy is reasonable and we will see what her repeat labs look like as well.  I would like to see her for repeat endoscopy in the coming months when she returns from her trip.   Aloha Finner, MD [facility] Advanced Endoscopy Office # [phone]

## 2024-04-16 ENCOUNTER — Ambulatory Visit: Payer: Self-pay | Admitting: Gastroenterology

## 2024-04-16 LAB — AFP TUMOR MARKER: AFP-Tumor Marker: 1.9 ng/mL

## 2024-04-19 ENCOUNTER — Other Ambulatory Visit: Payer: Self-pay

## 2024-04-19 MED ORDER — SPIRONOLACTONE 50 MG PO TABS: 50.0000 mg | ORAL_TABLET | Freq: Every day | ORAL | 6 refills | Status: AC

## 2024-04-19 MED ORDER — FUROSEMIDE 20 MG PO TABS: 20.0000 mg | ORAL_TABLET | Freq: Every day | ORAL | 6 refills | Status: AC

## 2024-04-21 ENCOUNTER — Ambulatory Visit (HOSPITAL_COMMUNITY): Admission: RE | Admit: 2024-04-21 | Discharge: 2024-04-21 | Attending: Gastroenterology

## 2024-04-21 DIAGNOSIS — I8500 Esophageal varices without bleeding: Secondary | ICD-10-CM | POA: Insufficient documentation

## 2024-04-21 DIAGNOSIS — K7469 Other cirrhosis of liver: Secondary | ICD-10-CM | POA: Diagnosis present

## 2024-04-21 DIAGNOSIS — K766 Portal hypertension: Secondary | ICD-10-CM | POA: Diagnosis present

## 2024-04-21 DIAGNOSIS — K3189 Other diseases of stomach and duodenum: Secondary | ICD-10-CM | POA: Diagnosis present

## 2024-04-21 DIAGNOSIS — K5901 Slow transit constipation: Secondary | ICD-10-CM | POA: Diagnosis present

## 2024-04-21 DIAGNOSIS — D696 Thrombocytopenia, unspecified: Secondary | ICD-10-CM | POA: Insufficient documentation

## 2024-05-04 ENCOUNTER — Other Ambulatory Visit: Payer: Self-pay
